# Patient Record
Sex: Male | Born: 1950
Health system: Southern US, Community
[De-identification: ages and names within clinical notes are randomized; demographics above are authoritative.]

## PROBLEM LIST (undated history)

## (undated) DIAGNOSIS — E785 Hyperlipidemia, unspecified: Secondary | ICD-10-CM

## (undated) DIAGNOSIS — I10 Essential (primary) hypertension: Secondary | ICD-10-CM

## (undated) DIAGNOSIS — M1990 Unspecified osteoarthritis, unspecified site: Secondary | ICD-10-CM

## (undated) DIAGNOSIS — K429 Umbilical hernia without obstruction or gangrene: Secondary | ICD-10-CM

## (undated) HISTORY — DX: Essential (primary) hypertension: I10

## (undated) HISTORY — PX: SHOULDER SURGERY: SHX246

## (undated) HISTORY — DX: Unspecified osteoarthritis, unspecified site: M19.90

## (undated) HISTORY — DX: Hyperlipidemia, unspecified: E78.5

---

## 2006-10-20 ENCOUNTER — Ambulatory Visit: Payer: Self-pay | Admitting: Internal Medicine

## 2006-10-21 ENCOUNTER — Ambulatory Visit (HOSPITAL_COMMUNITY): Admission: RE | Admit: 2006-10-21 | Discharge: 2006-10-21 | Payer: Self-pay | Admitting: Family Medicine

## 2006-10-21 ENCOUNTER — Ambulatory Visit: Payer: Self-pay | Admitting: *Deleted

## 2006-10-25 ENCOUNTER — Ambulatory Visit: Payer: Self-pay | Admitting: Internal Medicine

## 2006-11-01 ENCOUNTER — Encounter (INDEPENDENT_AMBULATORY_CARE_PROVIDER_SITE_OTHER): Payer: Self-pay | Admitting: Cardiology

## 2006-11-01 ENCOUNTER — Ambulatory Visit (HOSPITAL_COMMUNITY): Admission: RE | Admit: 2006-11-01 | Discharge: 2006-11-01 | Payer: Self-pay | Admitting: Internal Medicine

## 2006-11-07 ENCOUNTER — Encounter (INDEPENDENT_AMBULATORY_CARE_PROVIDER_SITE_OTHER): Payer: Self-pay | Admitting: Specialist

## 2006-11-07 ENCOUNTER — Ambulatory Visit (HOSPITAL_COMMUNITY): Admission: RE | Admit: 2006-11-07 | Discharge: 2006-11-07 | Payer: Self-pay | Admitting: Gastroenterology

## 2006-11-08 ENCOUNTER — Ambulatory Visit: Payer: Self-pay | Admitting: Internal Medicine

## 2007-01-24 ENCOUNTER — Ambulatory Visit: Payer: Self-pay | Admitting: Internal Medicine

## 2007-05-24 ENCOUNTER — Ambulatory Visit: Payer: Self-pay | Admitting: Internal Medicine

## 2007-06-01 ENCOUNTER — Ambulatory Visit (HOSPITAL_COMMUNITY): Admission: RE | Admit: 2007-06-01 | Discharge: 2007-06-01 | Payer: Self-pay | Admitting: Family Medicine

## 2007-07-17 ENCOUNTER — Ambulatory Visit: Payer: Self-pay | Admitting: Internal Medicine

## 2007-08-03 ENCOUNTER — Ambulatory Visit: Payer: Self-pay | Admitting: Internal Medicine

## 2007-09-06 ENCOUNTER — Encounter (INDEPENDENT_AMBULATORY_CARE_PROVIDER_SITE_OTHER): Payer: Self-pay | Admitting: *Deleted

## 2008-02-14 ENCOUNTER — Ambulatory Visit: Payer: Self-pay | Admitting: Internal Medicine

## 2008-02-14 LAB — CONVERTED CEMR LAB
ALT: 26 units/L (ref 0–53)
AST: 31 units/L (ref 0–37)
Albumin: 3.8 g/dL (ref 3.5–5.2)
Alkaline Phosphatase: 104 units/L (ref 39–117)
BUN: 16 mg/dL (ref 6–23)
HDL: 35 mg/dL — ABNORMAL LOW (ref >39)
LDL Cholesterol: 58 mg/dL (ref 0–99)
Microalb, Ur: 0.65 mg/dL (ref 0.00–1.89)
Potassium: 4 meq/L (ref 3.5–5.3)
Sodium: 135 meq/L (ref 135–145)

## 2008-10-09 ENCOUNTER — Ambulatory Visit: Payer: Self-pay | Admitting: Internal Medicine

## 2009-03-26 ENCOUNTER — Ambulatory Visit: Payer: Self-pay | Admitting: Internal Medicine

## 2009-03-26 LAB — CONVERTED CEMR LAB
ALT: 15 units/L (ref 0–53)
AST: 20 units/L (ref 0–37)
Calcium: 9.2 mg/dL (ref 8.4–10.5)
Chloride: 108 meq/L (ref 96–112)
Creatinine, Ser: 1.08 mg/dL (ref 0.40–1.50)
Microalb, Ur: 0.65 mg/dL (ref 0.00–1.89)
Sodium: 142 meq/L (ref 135–145)
Total Bilirubin: 1 mg/dL (ref 0.3–1.2)
Total CHOL/HDL Ratio: 3.3
VLDL: 47 mg/dL — ABNORMAL HIGH (ref 0–40)

## 2009-03-27 ENCOUNTER — Ambulatory Visit: Payer: Self-pay | Admitting: Internal Medicine

## 2009-07-17 ENCOUNTER — Ambulatory Visit: Payer: Self-pay | Admitting: Internal Medicine

## 2009-07-30 ENCOUNTER — Encounter (INDEPENDENT_AMBULATORY_CARE_PROVIDER_SITE_OTHER): Payer: Self-pay | Admitting: Internal Medicine

## 2009-07-30 ENCOUNTER — Ambulatory Visit: Payer: Self-pay | Admitting: Family Medicine

## 2009-07-30 LAB — CONVERTED CEMR LAB
Basophils Absolute: 0 10*3/uL (ref 0.0–0.1)
Lymphocytes Relative: 27 % (ref 12–46)
Neutro Abs: 2.9 10*3/uL (ref 1.7–7.7)
Platelets: 97 10*3/uL — ABNORMAL LOW (ref 150–400)
RDW: 16.4 % — ABNORMAL HIGH (ref 11.5–15.5)

## 2009-08-11 ENCOUNTER — Ambulatory Visit: Payer: Self-pay | Admitting: Hematology & Oncology

## 2009-08-13 ENCOUNTER — Ambulatory Visit (HOSPITAL_COMMUNITY): Admission: RE | Admit: 2009-08-13 | Discharge: 2009-08-13 | Payer: Self-pay | Admitting: Internal Medicine

## 2009-09-03 LAB — CBC WITH DIFFERENTIAL (CANCER CENTER ONLY)
BASO#: 0 10*3/uL (ref 0.0–0.2)
Eosinophils Absolute: 0.2 10*3/uL (ref 0.0–0.5)
HGB: 12.7 g/dL — ABNORMAL LOW (ref 13.0–17.1)
MCH: 30.6 pg (ref 28.0–33.4)
MCV: 89 fL (ref 82–98)
MONO#: 0.4 10*3/uL (ref 0.1–0.9)
MONO%: 9.3 % (ref 0.0–13.0)
NEUT#: 2.3 10*3/uL (ref 1.5–6.5)
RBC: 4.14 10*6/uL — ABNORMAL LOW (ref 4.20–5.70)
WBC: 4.2 10*3/uL (ref 4.0–10.0)

## 2009-09-03 LAB — TECHNOLOGIST REVIEW CHCC SATELLITE

## 2009-09-03 LAB — CHCC SATELLITE - SMEAR

## 2009-09-05 LAB — VITAMIN B12: Vitamin B-12: 268 pg/mL (ref 211–911)

## 2009-09-05 LAB — PROTEIN ELECTROPHORESIS, SERUM
Albumin ELP: 60.7 % (ref 55.8–66.1)
Total Protein, Serum Electrophoresis: 6.7 g/dL (ref 6.0–8.3)

## 2009-09-15 ENCOUNTER — Ambulatory Visit (HOSPITAL_COMMUNITY): Admission: RE | Admit: 2009-09-15 | Discharge: 2009-09-15 | Payer: Self-pay | Admitting: Hematology & Oncology

## 2009-09-15 ENCOUNTER — Encounter (INDEPENDENT_AMBULATORY_CARE_PROVIDER_SITE_OTHER): Payer: Self-pay | Admitting: Interventional Radiology

## 2009-09-17 ENCOUNTER — Ambulatory Visit: Payer: Self-pay | Admitting: Internal Medicine

## 2009-09-30 ENCOUNTER — Ambulatory Visit: Payer: Self-pay | Admitting: Hematology & Oncology

## 2009-10-01 LAB — CBC WITH DIFFERENTIAL (CANCER CENTER ONLY)
BASO#: 0 10*3/uL (ref 0.0–0.2)
Eosinophils Absolute: 0.2 10*3/uL (ref 0.0–0.5)
HGB: 14 g/dL (ref 13.0–17.1)
LYMPH#: 1.7 10*3/uL (ref 0.9–3.3)
NEUT#: 3 10*3/uL (ref 1.5–6.5)
Platelets: 110 10*3/uL — ABNORMAL LOW (ref 145–400)
RBC: 4.58 10*6/uL (ref 4.20–5.70)
WBC: 5.4 10*3/uL (ref 4.0–10.0)

## 2009-10-02 ENCOUNTER — Ambulatory Visit: Payer: Self-pay | Admitting: Internal Medicine

## 2010-01-02 ENCOUNTER — Ambulatory Visit: Payer: Self-pay | Admitting: Hematology & Oncology

## 2010-08-21 ENCOUNTER — Ambulatory Visit: Payer: Self-pay | Admitting: Internal Medicine

## 2010-12-07 ENCOUNTER — Encounter (INDEPENDENT_AMBULATORY_CARE_PROVIDER_SITE_OTHER): Payer: Self-pay | Admitting: Family Medicine

## 2010-12-07 LAB — CONVERTED CEMR LAB: Microalb, Ur: 7.62 mg/dL — ABNORMAL HIGH (ref 0.00–1.89)

## 2011-01-05 ENCOUNTER — Encounter (INDEPENDENT_AMBULATORY_CARE_PROVIDER_SITE_OTHER): Payer: Self-pay | Admitting: Family Medicine

## 2011-01-05 LAB — CONVERTED CEMR LAB
ALT: 34 units/L (ref 0–53)
Alkaline Phosphatase: 64 units/L (ref 39–117)
Glucose, Bld: 138 mg/dL — ABNORMAL HIGH (ref 70–99)
LDL Cholesterol: 40 mg/dL (ref 0–99)
Sodium: 141 meq/L (ref 135–145)
Total Bilirubin: 1 mg/dL (ref 0.3–1.2)
Total Protein: 7.6 g/dL (ref 6.0–8.3)
Triglycerides: 232 mg/dL — ABNORMAL HIGH (ref <150)
VLDL: 46 mg/dL — ABNORMAL HIGH (ref 0–40)

## 2011-03-26 LAB — DIFFERENTIAL
Basophils Absolute: 0 10*3/uL (ref 0.0–0.1)
Basophils Relative: 1 % (ref 0–1)
Eosinophils Absolute: 0.1 10*3/uL (ref 0.0–0.7)
Lymphocytes Relative: 24 % (ref 12–46)
Neutrophils Relative %: 58 % (ref 43–77)

## 2011-03-26 LAB — CBC
MCHC: 35 g/dL (ref 30.0–36.0)
RBC: 4.16 MIL/uL — ABNORMAL LOW (ref 4.22–5.81)

## 2011-03-26 LAB — PROTIME-INR: Prothrombin Time: 16.8 seconds — ABNORMAL HIGH (ref 11.6–15.2)

## 2011-03-26 LAB — GLUCOSE, CAPILLARY: Glucose-Capillary: 109 mg/dL — ABNORMAL HIGH (ref 70–99)

## 2011-05-07 NOTE — Op Note (Signed)
[name], [name] NO.:  [id number]   MEDICAL RECORD NO.:  [id number]          PATIENT TYPE:  AMB   LOCATION:  ENDO                         FACILITY:  [facility]   PHYSICIAN:  Graylin Shiver, M.D.   DATE OF BIRTH:  06/24/1951   DATE OF PROCEDURE:  11/07/2006  DATE OF DISCHARGE:                                 OPERATIVE REPORT   Esophagogastroduodenoscopy with biopsy.   INDICATION:  Anemia.   Informed consent was obtained after explanation of the risks of bleeding,  infection and perforation.   PREMEDICATION:  Fentanyl 50 mcg IV, Versed 4 mg IV.   PROCEDURE:  With the patient in the left lateral decubitus position, the  Olympus gastroscope was inserted into the oropharynx and passed into the  esophagus.  It was advanced down the esophagus then into the stomach and  into the duodenum.  The second portion and bulb of the duodenum looked  normal.  The stomach showed some mild erythema.  It was biopsied from the  antral area.  No ulcers, erosions or tumors were seen.  The body of the  stomach looked normal.  The fundus and cardia looked normal.  The esophagus  looked normal.  He tolerated the procedure well without complications.   IMPRESSION:  Mild erythema in the distal stomach, otherwise normal EGD,  biopsy was obtained from the distal stomach.   I see nothing on this exam to explain anemia.           ______________________________  Graylin Shiver, M.D.     SFG/MEDQ  D:  11/07/2006  T:  11/07/2006  Job:  [id number]   cc:   Dineen Kid. Reche Dixon, M.D.

## 2011-11-19 ENCOUNTER — Encounter: Payer: Self-pay | Admitting: Cardiology

## 2011-12-10 ENCOUNTER — Ambulatory Visit: Payer: Self-pay | Admitting: Cardiology

## 2012-01-18 ENCOUNTER — Encounter: Payer: Self-pay | Admitting: Cardiology

## 2012-01-18 ENCOUNTER — Ambulatory Visit (INDEPENDENT_AMBULATORY_CARE_PROVIDER_SITE_OTHER): Payer: Self-pay | Admitting: Cardiology

## 2012-01-18 VITALS — BP 141/85 | HR 102 | Resp 16 | Ht 66.0 in | Wt 183.0 lb

## 2012-01-18 DIAGNOSIS — R0989 Other specified symptoms and signs involving the circulatory and respiratory systems: Secondary | ICD-10-CM

## 2012-01-18 DIAGNOSIS — R9431 Abnormal electrocardiogram [ECG] [EKG]: Secondary | ICD-10-CM | POA: Insufficient documentation

## 2012-01-18 DIAGNOSIS — Z9189 Other specified personal risk factors, not elsewhere classified: Secondary | ICD-10-CM | POA: Insufficient documentation

## 2012-01-18 DIAGNOSIS — R0600 Dyspnea, unspecified: Secondary | ICD-10-CM

## 2012-01-18 DIAGNOSIS — F17200 Nicotine dependence, unspecified, uncomplicated: Secondary | ICD-10-CM

## 2012-01-18 DIAGNOSIS — R0609 Other forms of dyspnea: Secondary | ICD-10-CM

## 2012-01-18 NOTE — Assessment & Plan Note (Signed)
He uses some form of [place] smokeless tobacco and I suggested that he stop all tobacco products.

## 2012-01-18 NOTE — Assessment & Plan Note (Signed)
This will be evaluated in the context of treating the above.

## 2012-01-18 NOTE — Assessment & Plan Note (Signed)
The patient certainly has significant risk factors for coronary disease. He did have some mild aortic stiffness on an echo in 2007 I was able to find. I don't hear a murmur. However, I'm going to start with an echocardiogram. He also needs stress testing to further evaluate the etiology of this dyspnea. He needs primary risk reduction.

## 2012-01-18 NOTE — Patient Instructions (Signed)
Your physician has requested that you have an echocardiogram. Echocardiography is a painless test that uses sound waves to create images of your heart. It provides your doctor with information about the size and shape of your heart and how well your heart's chambers and valves are working. This procedure takes approximately one hour. There are no restrictions for this procedure.  Your physician has requested that you have a lexiscan myoview. For further information please visit [email or web address] Please follow instruction sheet, as given.  Continue all medications as listed  Follow up with Dr Seward Grater after testing

## 2012-01-18 NOTE — Progress Notes (Signed)
HPI The patient presents as a new patient. He has an abnormal EKG and dyspnea. The predominant issue is that he has multiple cardiovascular risk factors. He reports dyspnea for 4-5 months. This happens with activity such as walking an incline or upstairs 8 or 9 stairs at a time. He doesn't describe resting shortness of breath, PND or orthopnea. He does not describe chest pressure, neck or arm discomfort. He does have a fullness when he is short of breath. He thinks his symptoms have been relatively unchanged over 4-5 months. He's not describing any palpitations, presyncope or syncope. He does have some chronic mild ankle edema. It somewhat difficult obtaining his history as he speaks broken [place] and Hindi.  Not on File  Current Outpatient Prescriptions  Medication Sig Dispense Refill  . aspirin 81 MG tablet Take 81 mg by mouth daily.      Marland Kitchen glipiZIDE (GLUCOTROL) 5 MG tablet Take 5 mg by mouth 2 (two) times daily before a meal.      . ibuprofen (ADVIL,MOTRIN) 200 MG tablet Take 200 mg by mouth every 6 (six) hours as needed.      Marland Kitchen losartan (COZAAR) 25 MG tablet Take 25 mg by mouth daily.      . metFORMIN (GLUCOPHAGE) 1000 MG tablet Take 1,000 mg by mouth 2 (two) times daily with a meal.      . metolazone (ZAROXOLYN) 5 MG tablet Take 5 mg by mouth daily.      . pravastatin (PRAVACHOL) 10 MG tablet Take 10 mg by mouth daily.        Past Medical History  Diagnosis Date  . Arthritis   . Hyperlipidemia   . Diabetes mellitus   . Hypertension     Past Surgical History  Procedure Date  . Shoulder surgery     Right    Family History  Problem Relation Age of Onset  . Hypertension Mother 49  . Coronary artery disease Father 65    History   Social History  . Marital Status: Married    Spouse Name: N/A    Number of Children: 2  . Years of Education: N/A   Occupational History  . Works for [employer]    Social History Main Topics  . Smoking status: Never Smoker   .  Smokeless tobacco: Current User    Types: Chew  . Alcohol Use: Not on file  . Drug Use: Not on file  . Sexually Active: Not on file   Other Topics Concern  . Not on file   Social History Narrative  . No narrative on file    ROS:  As stated in the HPI and negative for all other systems.  PHYSICAL EXAM BP 141/85  Pulse 102  Resp 16  Ht 5\' 6"  (1.676 m)  Wt 83.008 kg (183 lb)  BMI 29.54 kg/m2 GENERAL:  Well appearing HEENT:  Pupils equal round and reactive, fundi not visualized, oral mucosa unremarkable NECK:  No jugular venous distention, waveform within normal limits, carotid upstroke brisk and symmetric, no bruits, no thyromegaly LYMPHATICS:  No cervical, inguinal adenopathy LUNGS:  Clear to auscultation bilaterally BACK:  No CVA tenderness CHEST:  Unremarkable HEART:  PMI not displaced or sustained,S1 and S2 within normal limits, no S3, no S4, no clicks, no rubs, no murmurs ABD:  Flat, positive bowel sounds normal in frequency in pitch, no bruits, no rebound, no guarding, no midline pulsatile mass, no hepatomegaly, no splenomegaly EXT:  2 plus pulses  throughout, no edema, no cyanosis no clubbing SKIN:  No rashes no nodules NEURO:  Cranial nerves II through XII grossly intact, motor grossly intact throughout [facility]:  Cognitively intact, oriented to person place and time  EKG:  11/19/11 sinus rhythm, rate 80, left axis deviation, QTC slightly prolonged, no acute ST-T wave changes appear  ASSESSMENT AND PLAN

## 2012-01-18 NOTE — Assessment & Plan Note (Signed)
I will check a lipid profile when he returns.  Given the diabetes I will have a low threshold for starting a statin.

## 2012-02-02 ENCOUNTER — Ambulatory Visit (INDEPENDENT_AMBULATORY_CARE_PROVIDER_SITE_OTHER): Payer: Self-pay | Admitting: *Deleted

## 2012-02-02 ENCOUNTER — Other Ambulatory Visit: Payer: Self-pay

## 2012-02-02 ENCOUNTER — Ambulatory Visit (HOSPITAL_COMMUNITY): Payer: Self-pay | Attending: Cardiovascular Disease

## 2012-02-02 ENCOUNTER — Ambulatory Visit (HOSPITAL_COMMUNITY): Payer: Self-pay | Attending: Cardiovascular Disease | Admitting: Radiology

## 2012-02-02 VITALS — BP 119/74 | Ht 66.0 in | Wt 170.0 lb

## 2012-02-02 DIAGNOSIS — R0600 Dyspnea, unspecified: Secondary | ICD-10-CM

## 2012-02-02 DIAGNOSIS — R0609 Other forms of dyspnea: Secondary | ICD-10-CM | POA: Insufficient documentation

## 2012-02-02 DIAGNOSIS — R0989 Other specified symptoms and signs involving the circulatory and respiratory systems: Secondary | ICD-10-CM | POA: Insufficient documentation

## 2012-02-02 DIAGNOSIS — R0602 Shortness of breath: Secondary | ICD-10-CM | POA: Insufficient documentation

## 2012-02-02 DIAGNOSIS — I10 Essential (primary) hypertension: Secondary | ICD-10-CM | POA: Insufficient documentation

## 2012-02-02 DIAGNOSIS — Z9189 Other specified personal risk factors, not elsewhere classified: Secondary | ICD-10-CM

## 2012-02-02 DIAGNOSIS — E785 Hyperlipidemia, unspecified: Secondary | ICD-10-CM | POA: Insufficient documentation

## 2012-02-02 DIAGNOSIS — R9431 Abnormal electrocardiogram [ECG] [EKG]: Secondary | ICD-10-CM | POA: Insufficient documentation

## 2012-02-02 DIAGNOSIS — E119 Type 2 diabetes mellitus without complications: Secondary | ICD-10-CM | POA: Insufficient documentation

## 2012-02-02 DIAGNOSIS — Z8249 Family history of ischemic heart disease and other diseases of the circulatory system: Secondary | ICD-10-CM | POA: Insufficient documentation

## 2012-02-02 DIAGNOSIS — R000 Tachycardia, unspecified: Secondary | ICD-10-CM | POA: Insufficient documentation

## 2012-02-02 LAB — LIPID PANEL: Total CHOL/HDL Ratio: 5

## 2012-02-02 MED ORDER — TECHNETIUM TC 99M TETROFOSMIN IV KIT
33.0000 | PACK | Freq: Once | INTRAVENOUS | Status: AC | PRN
  Administered 2012-02-02: 33 via INTRAVENOUS

## 2012-02-02 MED ORDER — TECHNETIUM TC 99M TETROFOSMIN IV KIT
10.8000 | PACK | Freq: Once | INTRAVENOUS | Status: AC | PRN
  Administered 2012-02-02: 11 via INTRAVENOUS

## 2012-02-02 NOTE — Progress Notes (Signed)
[facility] SITE 3 NUCLEAR MED [address] [place] [address] [phone]  Cardiology Nuclear Med Study  Mark Chase is a 61 y.o. male [id number] 1951-08-12   Nuclear Med Background Indication for Stress Test:  Evaluation for Ischemia and Abnormal EKG History: '07 Echo:EF=60-70% Cardiac Risk Factors: Family History - CAD, Hypertension, Lipids and NIDDM  Symptoms:  DOE/SOB and Rapid HR    Nuclear Pre-Procedure Caffeine/Decaff Intake:  None NPO After: 7:00pm   Lungs:  Clear.  98% on RA. IV 0.9% NS with Angio Cath:  20g  IV Site: R Hand  IV Started by:  Bonnita Levan, RN  Chest Size (in):  44 Cup Size: n/a  Height: 5\' 6"  (1.676 m)  Weight:  170 lb (77.111 kg)  BMI:  Body mass index is 27.44 kg/(m^2). Tech Comments:  BS 132 this AM    Nuclear Med Study 1 or 2 day study: 1 day  Stress Test Type:  Stress  Reading MD: Charlton Haws, MD  Order Authorizing Provider:  Rollene Rotunda, MD  Resting Radionuclide: Technetium 48m Tetrofosmin  Resting Radionuclide Dose: 10.8 mCi   Stress Radionuclide:  Technetium 100m Tetrofosmin  Stress Radionuclide Dose: 33.0 mCi           Stress Protocol Rest HR: 71 Stress HR: 139  Rest BP: Sitting 119/74  Standing 134/83 Stress BP: 173/100*  Exercise Time (min): 6:46 METS: 7.0   Predicted Max HR: 160 bpm % Max HR: 86.88 bpm Rate Pressure Product: 27253   Dose of Adenosine (mg):  n/a Dose of Lexiscan: n/a mg  Dose of Atropine (mg): n/a Dose of Dobutamine: n/a mcg/kg/min (at max HR)  Stress Test Technologist: Smiley Houseman, CMA-N  Nuclear Technologist:  Doyne Keel, CNMT     Rest Procedure:  Myocardial perfusion imaging was performed at rest 45 minutes following the intravenous administration of Technetium 76m Tetrofosmin.  Rest ECG: Nonspecific T-wave changes.  Stress Procedure:  The patient exercised for 6:46 on the treadmill utilizing the Bruce protocol.  The patient stopped due to dyspnea with O2 SAT of 93% at peak  exercise and denied any chest pain.  There were no diagnostic ST-T wave changes.  He did have a mild hypertensive response to exercise, 173/100,  Technetium 65m Tetrofosmin was injected at peak exercise and myocardial perfusion imaging was performed after a brief delay.  Stress ECG: Baseline ECG abnormal with inferolateral T wave changes  QPS Raw Data Images:  Normal; no motion artifact; normal heart/lung ratio. Stress Images:  Normal homogeneous uptake in all areas of the myocardium. Rest Images:  Normal homogeneous uptake in all areas of the myocardium. Subtraction (SDS):  Normal Transient Ischemic Dilatation (Normal <1.22):  0.99 Lung/Heart Ratio (Normal <0.45):  0.28  Quantitative Gated Spect Images QGS EDV:  56 ml QGS ESV:  13 ml QGS cine images:  NL LV Function; NL Wall Motion QGS EF: 76%  Impression Exercise Capacity:  Fair exercise capacity. BP Response:  Normal blood pressure response. Clinical Symptoms:  There is dyspnea. ECG Impression:  Baseline ECG abnormal Comparison with Prior Nuclear Study: No previous nuclear study performed  Overall Impression:  Normal stress nuclear study.      Charlton Haws

## 2012-02-04 ENCOUNTER — Telehealth: Payer: Self-pay | Admitting: Cardiology

## 2012-02-04 NOTE — Telephone Encounter (Signed)
New Problem:     Patient's daughter called in wanting to know what the results were of her father's most recent blood work and cardiac testing. Please call back.

## 2012-02-04 NOTE — Telephone Encounter (Signed)
Reviewed all results with pt's daughter who states understanding

## 2012-10-05 ENCOUNTER — Other Ambulatory Visit: Payer: Self-pay

## 2012-10-05 ENCOUNTER — Telehealth: Payer: Self-pay | Admitting: Hematology & Oncology

## 2012-10-05 NOTE — Telephone Encounter (Signed)
Received call from daughter that pt was being referred back to [facility]. Per Tiffany in medical records has not received anything on this pt. I called Dr. Benetta Spar Rankin's office and Marylene Land is going to send me what they have.

## 2012-10-05 NOTE — Telephone Encounter (Signed)
Pt aware of 10-25-12 appointment. I called right back after talking to daughter and got voice mail. I left a message asking them to please call if we need to provide an interpreter.

## 2012-10-25 ENCOUNTER — Other Ambulatory Visit (HOSPITAL_BASED_OUTPATIENT_CLINIC_OR_DEPARTMENT_OTHER): Payer: Medicaid Other | Admitting: Lab

## 2012-10-25 ENCOUNTER — Ambulatory Visit (HOSPITAL_BASED_OUTPATIENT_CLINIC_OR_DEPARTMENT_OTHER): Payer: Medicaid Other | Admitting: Hematology & Oncology

## 2012-10-25 ENCOUNTER — Ambulatory Visit: Payer: Medicaid Other

## 2012-10-25 VITALS — BP 140/61 | HR 89 | Temp 97.9°F | Resp 20 | Ht 66.0 in | Wt 172.0 lb

## 2012-10-25 DIAGNOSIS — D696 Thrombocytopenia, unspecified: Secondary | ICD-10-CM

## 2012-10-25 DIAGNOSIS — E119 Type 2 diabetes mellitus without complications: Secondary | ICD-10-CM

## 2012-10-25 DIAGNOSIS — I10 Essential (primary) hypertension: Secondary | ICD-10-CM

## 2012-10-25 LAB — CBC WITH DIFFERENTIAL (CANCER CENTER ONLY)
BASO%: 0.1 % (ref 0.0–2.0)
EOS%: 0.9 % (ref 0.0–7.0)
HCT: 40.3 % (ref 38.7–49.9)
LYMPH%: 21.8 % (ref 14.0–48.0)
MCH: 30.2 pg (ref 28.0–33.4)
MCHC: 36.2 g/dL — ABNORMAL HIGH (ref 32.0–35.9)
MCV: 83 fL (ref 82–98)
NEUT%: 64.8 % (ref 40.0–80.0)
RDW: 14.7 % (ref 11.1–15.7)

## 2012-10-25 NOTE — Progress Notes (Signed)
This office note has been dictated.

## 2012-10-26 NOTE — Progress Notes (Signed)
CC:   Mark Chase, M.D.  DIAGNOSIS:  Mild thrombocytopenia.  HISTORY OF PRESENT ILLNESS:  Mark Chase is a 61 year old [place] gentleman.  He has been in the [place] for, I think, 7 years or so.  He has had problems with diabetes and hypertension and hyperlipidemia.  He is on several medications.  He is followed by Dr. Barbaraann Barthel.  Dr. Barbaraann Barthel had noted that he had some thrombocytopenia.  Mark Chase was seen 3 years ago for thrombocytopenia.  He actually had a bone marrow test done.  The bone marrow test basically was negative for any marrow pathology.  He had abundant megakaryocytes.  Cytogenetics were negative.  There was no evidence of an infiltrative of disease.  He has been doing okay.  No bleeding or bruising.  He has not had any weight loss or weight gain.  He is working without any problems.  Dr. Barbaraann Barthel felt that a hematologic evaluation was indicated and kindly referred him to the [facility].  His platelet count back on September 17th was 98,000.  His white cell count was 5.9 and hemoglobin 13.5.  Again, he has had no bleeding or bruising.  He has not noticed any skin rashes.  There has not been any change in his medications that he knows of.  He has not had any surgeries.  He has not had any leg swelling.  He has had no cough or shortness breath.  There has been no foreign travel.  Of note, back in August 2010 his platelet count was 97,000.  PAST MEDICAL HISTORY:  Remarkable for:  1. Non-insulin-dependent diabetes mellitus. 2. Hypertension. 3. Hyperlipidemia.  ALLERGIES:  None.  MEDICATIONS:  Aspirin 81 mg p.o. daily, Glucotrol 5 mg p.o. b.i.d., Cozaar 25 mg p.o. daily, Glucophage 1000 mg p.o. b.i.d., Zaroxolyn 5 mg p.o. daily, Pravachol 10 mg p.o. daily.  SOCIAL HISTORY:  Negative for tobacco use.  There is no alcohol use.  He has no occupational exposures.  FAMILY HISTORY:  Noncontributory.  REVIEW OF SYSTEMS:  As stated in  the history of present illness.  No additional findings are noted on 12-system review.  PHYSICAL EXAMINATION:  General:  This is a well-developed, well- nourished [place] gentleman in no obvious distress.  Vital signs: Temperature of 97.9, pulse 89, respiratory rate 20, blood pressure 140/61.  Weight is 172.  Head and neck:  Normocephalic, atraumatic skull.  There are no ocular or oral lesions.  There are no palpable cervical or supraclavicular lymph nodes.  Lungs:  Clear bilaterally. Cardiac:  Regular rate and rhythm with a normal S1 and S2.  There are no murmurs, rubs, or bruits.  Abdomen:  Soft with good bowel sounds.  There is no palpable abdominal mass.  There is no palpable hepatosplenomegaly. Back:  No tenderness over the spine, ribs, or hips.  Extremities:  No clubbing, cyanosis, or edema.  Neurological:  No focal neurological deficits.  Skin:  No rashes, ecchymoses, or petechiae.  LABORATORY STUDIES:  White cell count is 7, hemoglobin 14.6, hematocrit 40.3, platelet count 125.  Peripheral smear shows a normochromic, normocytic population of red blood cells.  There are no nucleated red blood cells.  There are no teardrop cells.  There are no schistocytes or spherocytes.  I see no rouleaux formation.  White cells appear normal in morphology and maturation.  There are no immature myeloid or lymphoid forms.  I see no hypersegmented polys.  I see no atypical lymphocytes.  Platelets are mildly decreased in  number.  Platelets are well granulated.  He has a few large platelets.  IMPRESSION:  Mark Chase is a nice 61 year old gentleman with mild thrombocytopenia.  He has been dealing with this now for 3 years.  His platelet count actually is better now than it was 3 years ago.  He had a bone marrow biopsy 3 years ago which may be suggestive of chronic immune thrombocytopenia.  I just do not see any indication for therapy at this point in time.  I do not see any indication for another  bone marrow test.  I do not see that we need to put him through a battery of a bunch of tests to rule out any kind of collagen vascular disease or other autoimmune phenomenon.  From my point of view, I really do not think we have to see Mark Chase back in the office.  His CBC can be checked twice a year.  If his platelet count gradually declines, then we can certainly see him back. Otherwise, I would just leave him alone.  I would continue his medications as he is taking.  I would keep him on the aspirin.  He is not at risk for bleeding.  I spent a good hour with Mark Chase.  A translator came with him.  Mark Chase, however, seemed to have a fairly good grasp of what was being told.    ______________________________ Mark Chase, M.D. PRE/MEDQ  D:  10/25/2012  T:  10/26/2012  Job:  [id number]

## 2012-10-27 ENCOUNTER — Other Ambulatory Visit: Payer: Self-pay

## 2013-11-02 ENCOUNTER — Encounter (HOSPITAL_COMMUNITY): Payer: Self-pay | Admitting: Emergency Medicine

## 2013-11-02 ENCOUNTER — Emergency Department (HOSPITAL_COMMUNITY): Payer: Medicaid Other

## 2013-11-02 ENCOUNTER — Emergency Department (HOSPITAL_COMMUNITY)
Admission: EM | Admit: 2013-11-02 | Discharge: 2013-11-02 | Disposition: A | Discharge status: Home / Self Care | Payer: Medicaid Other | Attending: Emergency Medicine | Admitting: Emergency Medicine

## 2013-11-02 DIAGNOSIS — M5030 Other cervical disc degeneration, unspecified cervical region: Secondary | ICD-10-CM | POA: Insufficient documentation

## 2013-11-02 DIAGNOSIS — F10129 Alcohol abuse with intoxication, unspecified: Secondary | ICD-10-CM

## 2013-11-02 DIAGNOSIS — M129 Arthropathy, unspecified: Secondary | ICD-10-CM | POA: Insufficient documentation

## 2013-11-02 DIAGNOSIS — I10 Essential (primary) hypertension: Secondary | ICD-10-CM | POA: Insufficient documentation

## 2013-11-02 DIAGNOSIS — F1010 Alcohol abuse, uncomplicated: Secondary | ICD-10-CM | POA: Insufficient documentation

## 2013-11-02 DIAGNOSIS — E785 Hyperlipidemia, unspecified: Secondary | ICD-10-CM | POA: Insufficient documentation

## 2013-11-02 DIAGNOSIS — Z7982 Long term (current) use of aspirin: Secondary | ICD-10-CM | POA: Insufficient documentation

## 2013-11-02 DIAGNOSIS — E119 Type 2 diabetes mellitus without complications: Secondary | ICD-10-CM | POA: Insufficient documentation

## 2013-11-02 DIAGNOSIS — Z79899 Other long term (current) drug therapy: Secondary | ICD-10-CM | POA: Insufficient documentation

## 2013-11-02 LAB — RAPID URINE DRUG SCREEN, HOSP PERFORMED
Amphetamines: NOT DETECTED
Barbiturates: NOT DETECTED
Benzodiazepines: NOT DETECTED
Cocaine: NOT DETECTED
Opiates: NOT DETECTED
Tetrahydrocannabinol: NOT DETECTED

## 2013-11-02 LAB — CBC
HCT: 42.2 % (ref 39.0–52.0)
Hemoglobin: 15.4 g/dL (ref 13.0–17.0)
MCH: 30.6 pg (ref 26.0–34.0)
MCHC: 36.5 g/dL — ABNORMAL HIGH (ref 30.0–36.0)
MCV: 83.9 fL (ref 78.0–100.0)
Platelets: 115 10*3/uL — ABNORMAL LOW (ref 150–400)
RBC: 5.03 MIL/uL (ref 4.22–5.81)
RDW: 13.9 % (ref 11.5–15.5)
WBC: 5.8 10*3/uL (ref 4.0–10.5)

## 2013-11-02 LAB — BASIC METABOLIC PANEL
BUN: 9 mg/dL (ref 6–23)
CO2: 21 mEq/L (ref 19–32)
Calcium: 9.7 mg/dL (ref 8.4–10.5)
Chloride: 105 mEq/L (ref 96–112)
Creatinine, Ser: 1.05 mg/dL (ref 0.50–1.35)
GFR calc Af Amer: 86 mL/min — ABNORMAL LOW (ref >90)
GFR calc non Af Amer: 74 mL/min — ABNORMAL LOW (ref >90)
Glucose, Bld: 156 mg/dL — ABNORMAL HIGH (ref 70–99)
Potassium: 4 mEq/L (ref 3.5–5.1)
Sodium: 142 mEq/L (ref 135–145)

## 2013-11-02 LAB — ETHANOL: Alcohol, Ethyl (B): 322 mg/dL — ABNORMAL HIGH (ref 0–11)

## 2013-11-02 NOTE — ED Notes (Signed)
136 CBG.

## 2013-11-02 NOTE — ED Notes (Signed)
Bed: WA01 Expected date:  Expected time:  Means of arrival:  Comments: EMS-ETOH, found down

## 2013-11-02 NOTE — ED Notes (Addendum)
Patient states he has pain every day in his right shoulder and back for the past 10 years. States he only had 2 beers to drink today, denies use of any other drugs. Patient denies any medical conditions other than HTN. Patient is alert and oriented x 4, speaks Hindi and limited English, appears slow to respond and uses exaggerated hand clapping at inappropriate times. Denies falling or injuring head.

## 2013-11-02 NOTE — ED Notes (Signed)
Patient states that address is [address], [place].

## 2013-11-02 NOTE — ED Notes (Signed)
Per EMS, found on side of road, [address] and [address]-weaving in and out of traffic on foot, found by [facility]-[facility] 166, BP 140/76

## 2013-11-02 NOTE — ED Provider Notes (Signed)
CSN: [id number]     Arrival date & time 11/02/13  1440 History   First MD Initiated Contact with Patient 11/02/13 1507     Chief Complaint  Patient presents with  . Alcohol Intoxication   (Consider location/radiation/quality/duration/timing/severity/associated sxs/prior Treatment) Patient is a 62 y.o. male presenting with intoxication. The history is provided by the patient. The history is limited by a language barrier. A language interpreter was used.  Alcohol Intoxication Associated symptoms include arthralgias, myalgias and neck pain. Pertinent negatives include no abdominal pain, chest pain, headaches or weakness.   Pt is a 62yo male BIB EMS after being found by [employer] on weaving in and out of traffic on foot at [employer] and [address].  On scene, CBG-166, BP 140/76.  Per pt he reports pain in his shoulders and back for 10 years but no new pain today. Reports drinking 2 beers but denies drug use.  Reports having HTN but no other significant medical problems. Denies hx of DM.  Denies falling or hitting head. Denies head, chest or abdominal pain. Denies SOB, nausea or vomiting.   Pt states he does live with his wife but she cannot come to the ER because she is unable to drive.  Denies any other concerns at this time.  Past Medical History  Diagnosis Date  . Arthritis   . Hyperlipidemia   . Diabetes mellitus   . Hypertension    Past Surgical History  Procedure Laterality Date  . Shoulder surgery      Right   Family History  Problem Relation Age of Onset  . Hypertension Mother 35  . Coronary artery disease Father 19   History  Substance Use Topics  . Smoking status: Never Smoker   . Smokeless tobacco: Current User    Types: Chew  . Alcohol Use: Yes    Review of Systems  Cardiovascular: Negative for chest pain.  Gastrointestinal: Negative for abdominal pain.  Musculoskeletal: Positive for arthralgias, myalgias and neck pain. Negative for neck stiffness.       Bilateral shoulder  pain, but rubbing neck.  Neurological: Negative for weakness, light-headedness and headaches.  All other systems reviewed and are negative.    Allergies  Review of patient's allergies indicates no known allergies.  Home Medications   Current Outpatient Rx  Name  Route  Sig  Dispense  Refill  . aspirin 81 MG tablet   Oral   Take 81 mg by mouth daily.         . fenofibrate (TRICOR) 145 MG tablet   Oral   Take 145 mg by mouth daily.         Marland Kitchen gabapentin (NEURONTIN) 300 MG capsule   Oral   Take 300 mg by mouth 2 (two) times daily.         Marland Kitchen glipiZIDE (GLUCOTROL XL) 5 MG 24 hr tablet   Oral   Take 5 mg by mouth daily with breakfast.         . metFORMIN (GLUCOPHAGE) 1000 MG tablet   Oral   Take 1,000 mg by mouth 2 (two) times daily with a meal.         . pravastatin (PRAVACHOL) 40 MG tablet   Oral   Take 40 mg by mouth daily.          BP 132/72  Pulse 114  Temp(Src) 98 F (36.7 C) (Oral)  Resp 16  SpO2 97% Physical Exam  Nursing note and vitals reviewed. Constitutional: He is oriented to person,  place, and time. He appears well-developed and well-nourished.  Pt lying in exam bed, NAD.  HENT:  Head: Normocephalic and atraumatic.  Eyes: Conjunctivae are normal. No scleral icterus.  Neck: Normal range of motion.  Cardiovascular: Normal rate, regular rhythm and normal heart sounds.   Pulmonary/Chest: Effort normal and breath sounds normal. No respiratory distress. He has no wheezes. He has no rales. He exhibits no tenderness.  Abdominal: Soft. Bowel sounds are normal. He exhibits no distension and no mass. There is no tenderness. There is no rebound and no guarding.  Musculoskeletal: Normal range of motion.  Neurological: He is alert and oriented to person, place, and time.  A&Ox3 however uses exaggerated clapping motion at inappropriate times. Slow to speak.  Skin: Skin is warm and dry.    ED Course  Procedures (including critical care time) Labs  Review Labs Reviewed  GLUCOSE, CAPILLARY - Abnormal; Notable for the following:    Glucose-Capillary 139 (*)    All other components within normal limits  CBC - Abnormal; Notable for the following:    MCHC 36.5 (*)    Platelets 115 (*)    All other components within normal limits  BASIC METABOLIC PANEL - Abnormal; Notable for the following:    Glucose, Bld 156 (*)    GFR calc non Af Amer 74 (*)    GFR calc Af Amer 86 (*)    All other components within normal limits  ETHANOL - Abnormal; Notable for the following:    Alcohol, Ethyl (B) 322 (*)    All other components within normal limits  URINE RAPID DRUG SCREEN (HOSP PERFORMED)   Imaging Review Ct Head Wo Contrast  11/02/2013   CLINICAL DATA:  Found on side of road, evaluate for injury  EXAM: CT HEAD WITHOUT CONTRAST  CT NECK WITHOUT CONTRAST  TECHNIQUE: Contiguous axial images were obtained from the base of the skull through the vertex without contrast. Multidetector CT imaging of the neck was performed using the standard protocol without intravenous contrast.  COMPARISON:  None.  FINDINGS: CT HEAD FINDINGS  Mild atrophy with sulcal prominence. Scattered minimal periventricular hypodensities compatible with microvascular ischemic disease. The gray-white differentiation is otherwise well maintained without CT evidence of acute large territory infarct. No intraparenchymal or extra-axial mass or hemorrhage. Normal size and configuration of the ventricles and basilar cisterns. No midline shift. Regional soft tissues are normal. No displaced calvarial fracture.  CT NECK FINDINGS  C1 to the inferior endplate of T1 is imaged.  There is mild straightening of expected cervical lordosis. No anterolisthesis or retrolisthesis. The bilateral facets are normally aligned. The dens is normally positioned between the lateral masses of C1. Normal atlantodental and atlantoaxial articulations.  No fracture or static subluxation of the cervical spine. Cervical  vertebral body heights are preserved. Prevertebral soft tissues are normal.  There is multilevel moderate to severe cervical spine DDD, likely worse at C5-C6 and C6-C7 with disk space height loss, endplate irregularity and posteriorly directed disk osteophyte complexes at these levels. Incidental note is made of mild expansion of the left-sided (sagittal image 38, series 9) and to a lesser extent, the right-sided, C5-C6 transverse foramina (sagittal image 24).  Calcifications within the bilateral carotid bulbs. Regional soft tissues are otherwise normal. Minimal biapical pleural parenchymal thickening, right greater than left. There is a possible 6 mm nodule within the image right lung apex (image 87, series 5).  IMPRESSION: 1. Mild atrophy and microvascular ischemic disease without acute intracranial process 2. No fracture  or static subluxation of the cervical spine. 3. Moderate to severe multilevel cervical spine DDD, likely worse at C5-C6 and C6-C7. 4. Possible mild expansion of the bilateral C5-C6 transverse foramina, left greater than right, nonspecific though could be seen in the setting of a peripheral nerve sheath tumor. Further evaluation with nonemergent cervical spine MRI may be performed as clinically indicated. 5. Possible 6 mm noncalcified nodule within the image right lung apex. Correlation a prior examinations (if available) is recommended. Otherwise, further evaluation with nonemergent chest CT may be performed as clinically indicated.   Electronically Signed   By: Simonne Come M.D.   On: 11/02/2013 17:05   Ct Cervical Spine Wo Contrast  11/02/2013   CLINICAL DATA:  Found on side of road, evaluate for injury  EXAM: CT HEAD WITHOUT CONTRAST  CT NECK WITHOUT CONTRAST  TECHNIQUE: Contiguous axial images were obtained from the base of the skull through the vertex without contrast. Multidetector CT imaging of the neck was performed using the standard protocol without intravenous contrast.  COMPARISON:   None.  FINDINGS: CT HEAD FINDINGS  Mild atrophy with sulcal prominence. Scattered minimal periventricular hypodensities compatible with microvascular ischemic disease. The gray-white differentiation is otherwise well maintained without CT evidence of acute large territory infarct. No intraparenchymal or extra-axial mass or hemorrhage. Normal size and configuration of the ventricles and basilar cisterns. No midline shift. Regional soft tissues are normal. No displaced calvarial fracture.  CT NECK FINDINGS  C1 to the inferior endplate of T1 is imaged.  There is mild straightening of expected cervical lordosis. No anterolisthesis or retrolisthesis. The bilateral facets are normally aligned. The dens is normally positioned between the lateral masses of C1. Normal atlantodental and atlantoaxial articulations.  No fracture or static subluxation of the cervical spine. Cervical vertebral body heights are preserved. Prevertebral soft tissues are normal.  There is multilevel moderate to severe cervical spine DDD, likely worse at C5-C6 and C6-C7 with disk space height loss, endplate irregularity and posteriorly directed disk osteophyte complexes at these levels. Incidental note is made of mild expansion of the left-sided (sagittal image 38, series 9) and to a lesser extent, the right-sided, C5-C6 transverse foramina (sagittal image 24).  Calcifications within the bilateral carotid bulbs. Regional soft tissues are otherwise normal. Minimal biapical pleural parenchymal thickening, right greater than left. There is a possible 6 mm nodule within the image right lung apex (image 87, series 5).  IMPRESSION: 1. Mild atrophy and microvascular ischemic disease without acute intracranial process 2. No fracture or static subluxation of the cervical spine. 3. Moderate to severe multilevel cervical spine DDD, likely worse at C5-C6 and C6-C7. 4. Possible mild expansion of the bilateral C5-C6 transverse foramina, left greater than right,  nonspecific though could be seen in the setting of a peripheral nerve sheath tumor. Further evaluation with nonemergent cervical spine MRI may be performed as clinically indicated. 5. Possible 6 mm noncalcified nodule within the image right lung apex. Correlation a prior examinations (if available) is recommended. Otherwise, further evaluation with nonemergent chest CT may be performed as clinically indicated.   Electronically Signed   By: Simonne Come M.D.   On: 11/02/2013 17:05    EKG Interpretation   None       MDM   1. Alcohol abuse with intoxication   2. Degenerative disc disease, cervical    Pt with possible Etoh intoxication found by [employer] earlier today weaving in and out of traffic on foot.  Pt reports just 2 beers today  but appears to be slightly altered and keeps making hand clapping gesture at inappropriate times.  Pt does have minor abrasion above left eyebrow.  Will get head and neck CT.  Labs: CBC, BMP, etoh, and urine drug.  CBG: 136  CT head: mild atrophy and microvascular ischemic disease w/o acute intracranial process CT neck: moderate to severe multilevel cervical spina DDD, worse at C5-6, C6-C7.  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 Non-emergent cervical spine MRI recommended.  Discussed findings and recommendations with pt.  While waiting on lab results, pt stated he was ready to go home.  Admits to being paid today at work so he went and bought alcohol to drink.  Pt is unclear on how much he consumed but states he feels better.  Pt is more alert and walking with steady gait.  Once labs are back, pt may be discharged home as he appears clinically sober at this time.   Labs: consistent with pt's report of drinking alcohol as well as findings of clinical intoxication.  Pt is able to ambulate around ER w/o assistance and is anxious to go home.  Pt discharged in stable continue. Return precautions provided. Advised to f/u with his PCP and share CT findings with  him.  Pt info packet on alcohol intoxication provided. Pt verbalized understanding with tx plan.  Discussed pt with attending during ED encounter and agrees with plan.    Junius Finner, PA-C 11/02/13 2017

## 2013-11-03 NOTE — ED Provider Notes (Signed)
Medical screening examination/treatment/procedure(s) were performed by non-physician practitioner and as supervising physician I was immediately available for consultation/collaboration.  EKG Interpretation   None         William Christena Sunderlin, MD 11/03/13 0031 

## 2014-12-25 ENCOUNTER — Ambulatory Visit: Payer: Medicaid Other | Attending: Family Medicine | Admitting: Physical Therapy

## 2014-12-25 DIAGNOSIS — M542 Cervicalgia: Secondary | ICD-10-CM | POA: Diagnosis not present

## 2015-05-19 IMAGING — CT CT HEAD W/O CM
3 series · 16 of 30 positions shown, 18 images · non-contrast
Comparison: None.

CLINICAL DATA: Found on side of road, evaluate for injury

EXAM:
CT HEAD WITHOUT CONTRAST
CT NECK WITHOUT CONTRAST
TECHNIQUE: Contiguous axial images were obtained from the base of the skull
through the vertex without contrast. Multidetector CT imaging of the
neck was performed using the standard protocol without intravenous
contrast.

[Series 2: head w/o · axial · non-contrast · 0.47mm/px · z∈[-665,-560]mm · 5 of 33 slices shown, 7 images]
[im 6/33  brain]
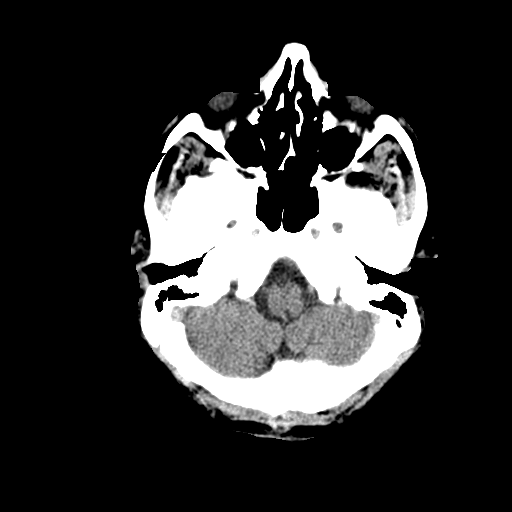
[im 6/33  bone]
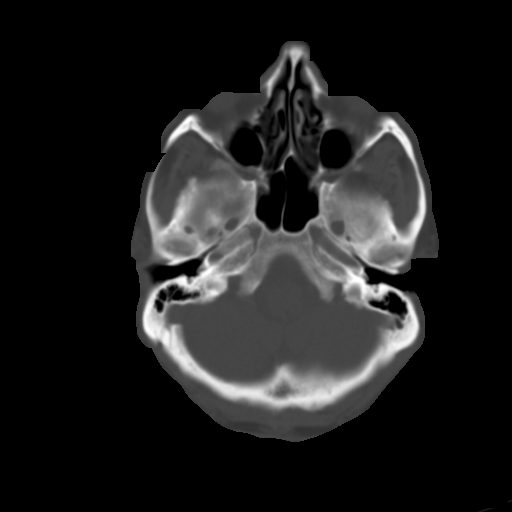
[im 11/33  brain]
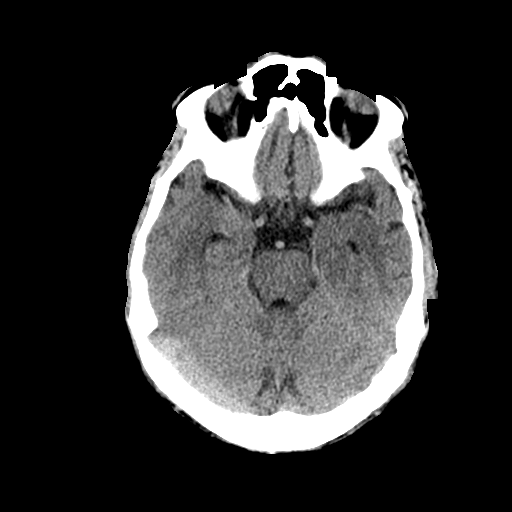
[im 17/33  brain]
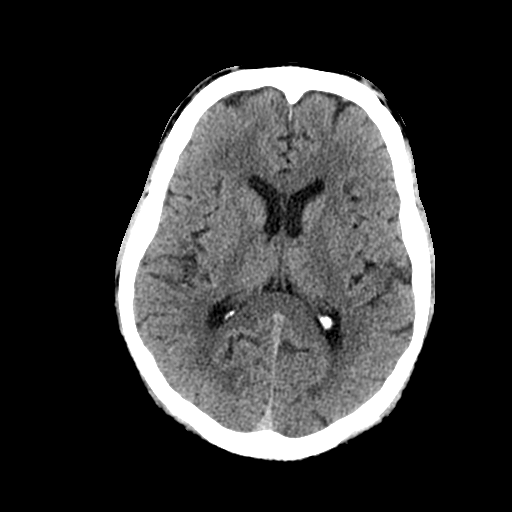
[im 22/33  brain]
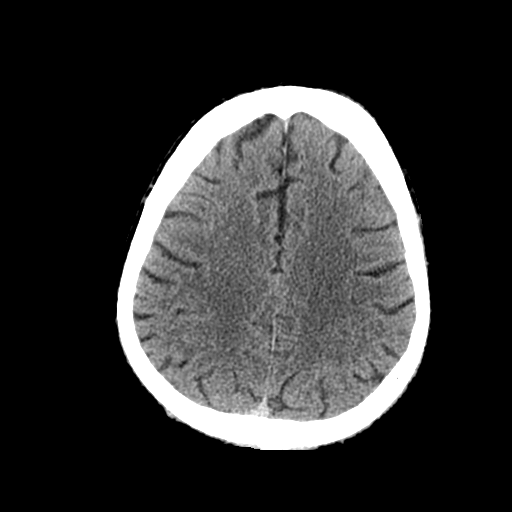
[im 27/33  brain]
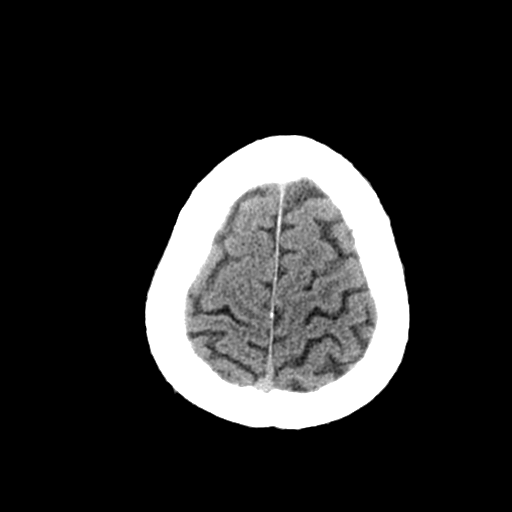
[im 27/33  bone]
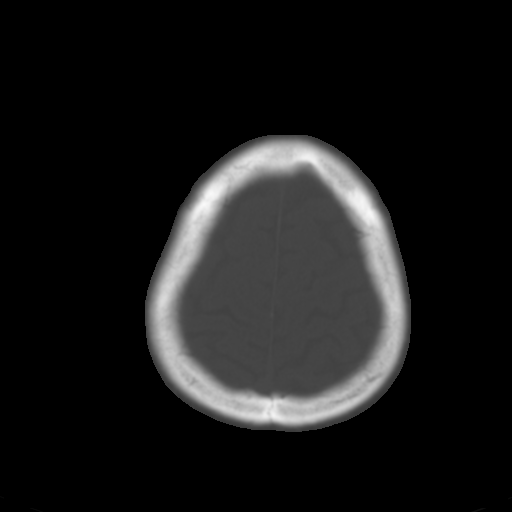

[Series 3: bone windows · axial · 0.47mm/px · z∈[-660,-565]mm · 4 of 33 slices shown]
[im 7/33  bone]
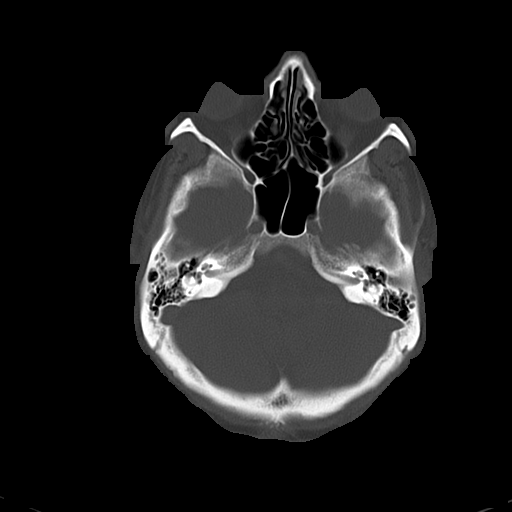
[im 13/33  bone]
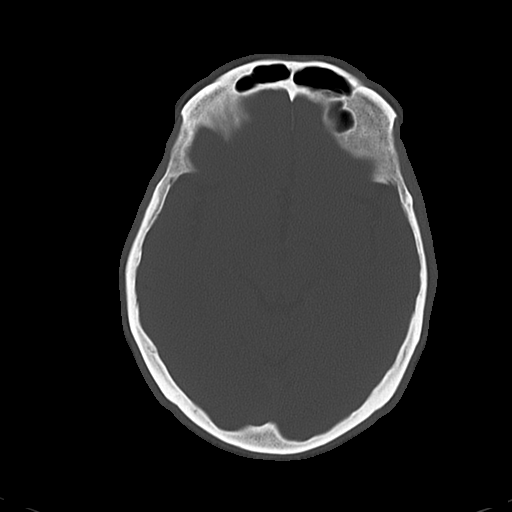
[im 20/33  bone]
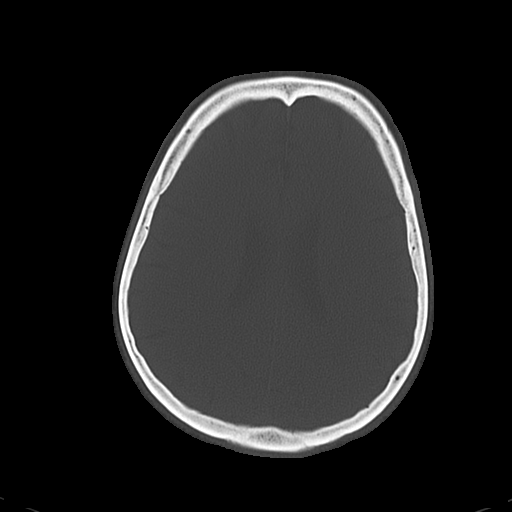
[im 26/33  bone]
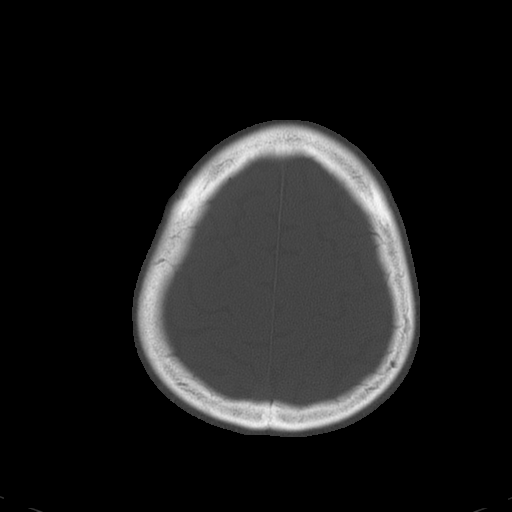

[Series 4: c-spine st · axial · 0.26mm/px · z∈[-826,-698]mm · 7 of 87 slices shown]
[im 6/87  brain]
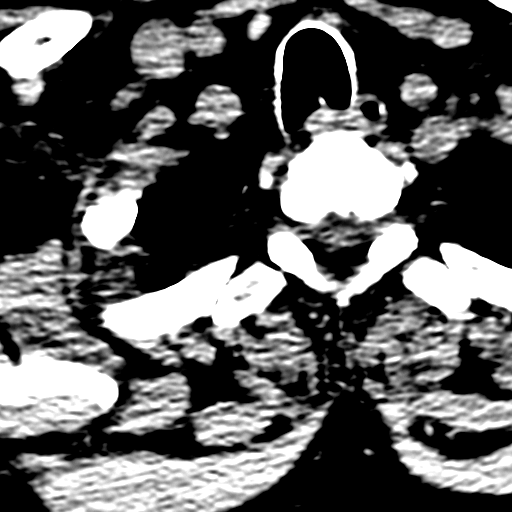
[im 17/87  brain]
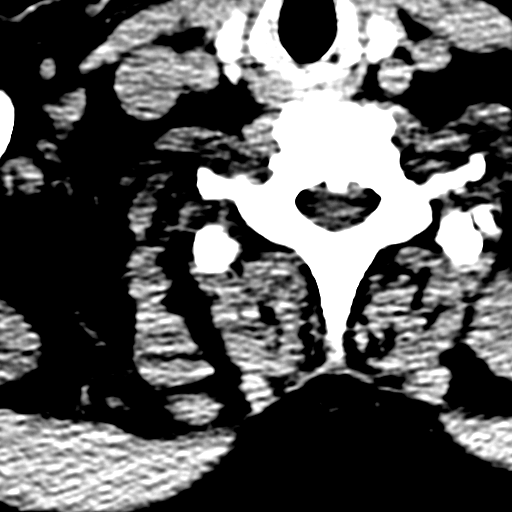
[im 27/87  brain]
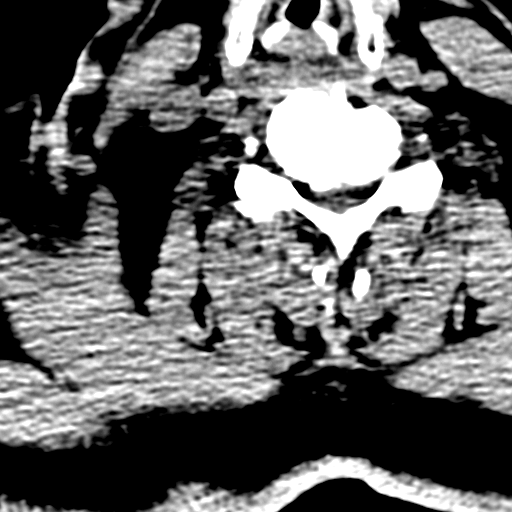
[im 38/87  brain]
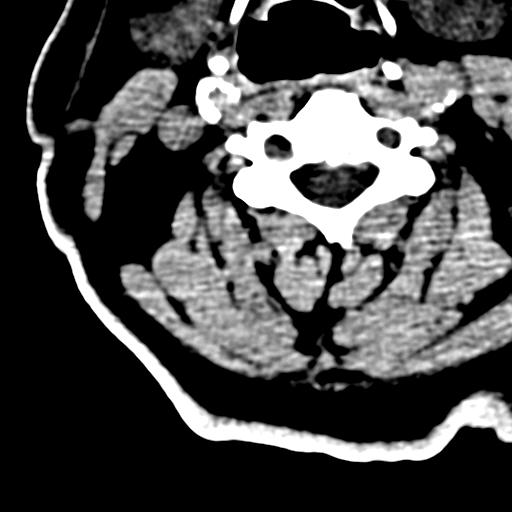
[im 49/87  brain]
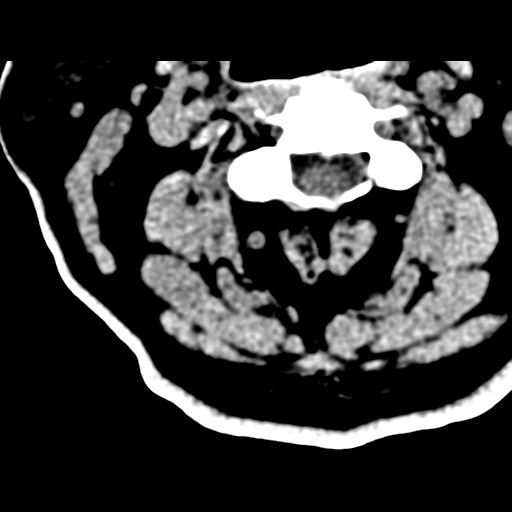
[im 60/87  brain]
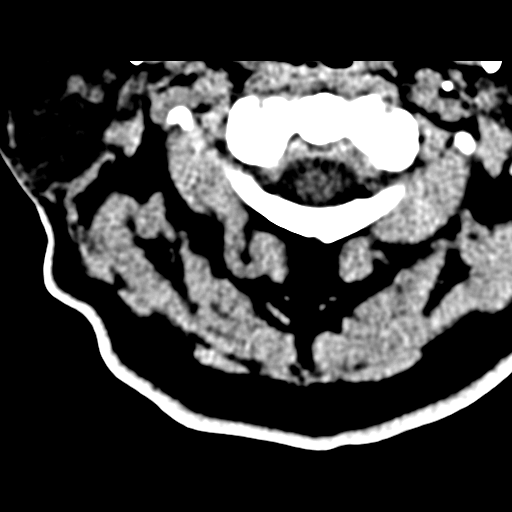
[im 70/87  brain]
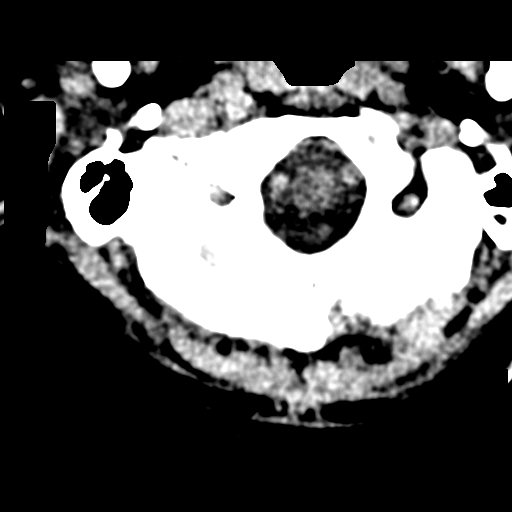

[16 of 30 positions shown; findings below may reference images not displayed]

FINDINGS: CT HEAD FINDINGS

Mild atrophy with sulcal prominence. Scattered minimal
periventricular hypodensities compatible with microvascular ischemic
disease. The gray-white differentiation is otherwise well maintained
without CT evidence of acute large territory infarct. No
intraparenchymal or extra-axial mass or hemorrhage. Normal size and
configuration of the ventricles and basilar cisterns. No midline
shift. Regional soft tissues are normal. No displaced calvarial
fracture.

CT NECK FINDINGS

C1 to the inferior endplate of T1 is imaged.

There is mild straightening of expected cervical lordosis. No
anterolisthesis or retrolisthesis. The bilateral facets are normally
aligned. The dens is normally positioned between the lateral masses
of C1. Normal atlantodental and atlantoaxial articulations.

No fracture or static subluxation of the cervical spine. Cervical
vertebral body heights are preserved. Prevertebral soft tissues are
normal.

There is multilevel moderate to severe cervical spine DDD, likely
worse at C5-C6 and C6-C7 with disk space height loss, endplate
irregularity and posteriorly directed disk osteophyte complexes at
these levels. Incidental note is made of mild expansion of the
left-sided (sagittal image 38, series 9) and to a lesser extent, the
right-sided, C5-C6 transverse foramina (sagittal image 24).

Calcifications within the bilateral carotid bulbs. Regional soft
tissues are otherwise normal. Minimal biapical pleural parenchymal
thickening, right greater than left. There is a possible 6 mm nodule
within the image right lung apex (image 87, series 5).
IMPRESSION: 1. Mild atrophy and microvascular ischemic disease without acute
intracranial process
2. No fracture or static subluxation of the cervical spine.
3. Moderate to severe multilevel cervical spine DDD, likely worse at
C5-C6 and C6-C7.
4. Possible mild expansion of the bilateral C5-C6 transverse
foramina, left greater than right, nonspecific though could be seen
in the setting of a peripheral nerve sheath tumor. Further
evaluation with nonemergent cervical spine MRI may be performed as
clinically indicated.
5. Possible 6 mm noncalcified nodule within the image right lung
apex. Correlation a prior examinations (if available) is
recommended. Otherwise, further evaluation with nonemergent chest CT
may be performed as clinically indicated.

## 2015-07-17 ENCOUNTER — Telehealth: Payer: Self-pay | Admitting: *Deleted

## 2015-07-17 NOTE — Telephone Encounter (Signed)
Requesting surgical clearance:   1. Type of surgery: Hernia Repair (under General anesthesia)  2. Surgeon: Gaynelle Adu  3. Surgical date: Pending  4. Medications that need to be help: Aspirin  5. Fax and/or phone: Deon Pilling, LPN (F) [phone]   How long can patient hold Aspirin?

## 2015-07-18 NOTE — Telephone Encounter (Signed)
Looks like he would need a new patient appt.  Doesn't look like the patient has been seen in 3 years.

## 2015-07-18 NOTE — Telephone Encounter (Signed)
Called pt and stated he will need an appointment, message send to billie for scheduling

## 2015-07-21 ENCOUNTER — Telehealth: Payer: Self-pay | Admitting: Cardiovascular Disease

## 2015-07-21 NOTE — Telephone Encounter (Signed)
Received records from [facility] for appointment on 07/25/15 with Dr Duke Salvia.  Records given to [facility] (medical records) for Dr Leonides Sake schedule on 07/25/15. lp

## 2015-07-25 ENCOUNTER — Ambulatory Visit (INDEPENDENT_AMBULATORY_CARE_PROVIDER_SITE_OTHER): Payer: Medicaid Other | Admitting: Cardiovascular Disease

## 2015-07-25 ENCOUNTER — Encounter: Payer: Self-pay | Admitting: Cardiovascular Disease

## 2015-07-25 VITALS — BP 136/80 | HR 76 | Ht 66.0 in | Wt 170.1 lb

## 2015-07-25 DIAGNOSIS — Z01818 Encounter for other preprocedural examination: Secondary | ICD-10-CM | POA: Diagnosis not present

## 2015-07-25 LAB — BASIC METABOLIC PANEL
BUN: 14 mg/dL (ref 7–25)
CO2: 27 mmol/L (ref 20–31)
CREATININE: 1.14 mg/dL (ref 0.70–1.25)
Calcium: 9.7 mg/dL (ref 8.6–10.3)
Chloride: 100 mmol/L (ref 98–110)
Glucose, Bld: 141 mg/dL — ABNORMAL HIGH (ref 65–99)
Potassium: 5 mmol/L (ref 3.5–5.3)
SODIUM: 137 mmol/L (ref 135–146)

## 2015-07-25 NOTE — Patient Instructions (Signed)
PLEASE HAVE LABS - BMP  LAB  STATION OF YOUR CHOICE.  WILL CALL YOU WITH RESULTS.   Your physician recommends that you schedule a follow-up appointment ON AS NEEDED BASIS.

## 2015-07-25 NOTE — Progress Notes (Signed)
Cardiology Office Note   Date:  07/25/2015   ID:  Mark Chase, DOB 1951/05/04, MRN [id number]  PCP:  Izell Riverton, MD  Cardiologist:   Madilyn Hook, MD   Chief Complaint  Patient presents with  . New Evaluation    cardiac clearance - possible hernia repair, not yet scheduled. patient reports no cardiac complaints. patient did have oral surgery 5 days ago and has excessive bruising and is very concerned, patient has been off aspirin for a month.      History of Present Illness: Mark Chase is a 64 y.o. male with DM type II,  HTN and hypertriglyceridemia who presents for presurgical evaluation.  Mark Chase is preparing to undergo umbilical hernia surgery. He was seen by Dr. Gaynelle Adu and referred for presurgical evaluation due to cardiovascular risk factors. Mark Chase reports shortness of breath after walking several blocks. He is able to walk up a flight of stairs without shortness of breath. His dyspnea has increased slowly over time which he attributes to weight gain. He denies chest pain or pressure. He also denies nausea vomiting or diaphoresis.  Mark Chase has had similar symptoms for similar years and was evaluated for this dyspnea and 2013. At that time he underwent echocardiography that revealed aortic valve calcifications and nuclear stress testing that was negative for ischemia. His symptoms have not changed since that time. He notes occasional lower extremity edema in his feet that improves by morning. He denies orthopnea PND or weight change.   [name] had dentures implanted 5 days go.  Since then he notes significant bruising.  There was also a lot of swelling, though this has improved significantly.  Mark Chase is Brother has heart disease and his father had a myocardial infarction at age 21.  Past Medical History  Diagnosis Date  . Arthritis   . Hyperlipidemia   . Diabetes mellitus   . Hypertension     Past Surgical History  Procedure Laterality Date  .  Shoulder surgery      Right     Current Outpatient Prescriptions  Medication Sig Dispense Refill  . canagliflozin (INVOKANA) 300 MG TABS tablet Take 300 mg by mouth daily.    . fenofibrate (TRICOR) 145 MG tablet Take 145 mg by mouth daily.    Marland Kitchen gabapentin (NEURONTIN) 300 MG capsule Take 300 mg by mouth 2 (two) times daily.    Marland Kitchen glipiZIDE (GLUCOTROL XL) 5 MG 24 hr tablet Take 5 mg by mouth daily with breakfast.    . losartan (COZAAR) 50 MG tablet Take 50 mg by mouth daily.    . metFORMIN (GLUCOPHAGE) 1000 MG tablet Take 1,000 mg by mouth 2 (two) times daily with a meal.    . pravastatin (PRAVACHOL) 40 MG tablet Take 40 mg by mouth daily.    Marland Kitchen aspirin 81 MG tablet Take 81 mg by mouth daily.     No current facility-administered medications for this visit.    Allergies:   Lisinopril    Social History:  The patient  reports that he has never smoked. His smokeless tobacco use includes Chew. He reports that he drinks alcohol.   Family History:  The patient's family history includes Coronary artery disease (age of onset: 29) in his father; Hypertension (age of onset: 42) in his mother.    ROS:  Please see the history of present illness.   Otherwise, review of systems are positive for none.   All other systems are reviewed and negative.  PHYSICAL EXAM: VS:  BP 136/80 mmHg  Pulse 76  Ht  (1.676 m)  Wt 77.157 kg (170 lb 1.6 oz)  BMI 27.47 kg/m2 , BMI Body mass index is 27.47 kg/(m^2). GENERAL:  Well appearing HEENT:  Pupils equal round and reactive, fundi not visualized, Newly implanted dentures with significant ecchymoses of the lower mandible NECK:  No jugular venous distention, waveform within normal limits, carotid upstroke brisk and symmetric, no bruits, no thyromegaly LYMPHATICS:  No cervical adenopathy LUNGS:  Clear to auscultation bilaterally HEART:  RRR.  PMI not displaced or sustained,S1 and S2 within normal limits, no S3, no S4, no clicks, no rubs, no murmurs ABD:   Flat, positive bowel sounds normal in frequency in pitch, no bruits, no rebound, no guarding, no midline pulsatile mass, no hepatomegaly, no splenomegaly EXT:  2 plus pulses throughout, trace edema in bilateral feet, no cyanosis no clubbing SKIN:  No rashes no nodules NEURO:  Cranial nerves II through XII grossly intact, motor grossly intact throughout PSYCH:  Cognitively intact, oriented to person place and time    EKG:  EKG is ordered today. The ekg ordered today demonstrates sinus rhythm 76bpm.  L axis deviation.  Poor precordial R wave progression.   Recent Labs: No results found for requested labs within last 365 days.    Lipid Panel    Component Value Date/Time   CHOL 103 02/02/2012 0852   TRIG 107.0 02/02/2012 0852   HDL 21.80* 02/02/2012 0852   CHOLHDL 5 02/02/2012 0852   VLDL 21.4 02/02/2012 0852   LDLCALC 60 02/02/2012 0852      Wt Readings from Last 3 Encounters:  07/25/15 77.157 kg (170 lb 1.6 oz)  10/25/12 78.019 kg (172 lb)  02/02/12 77.111 kg (170 lb)      Other studies Reviewed: Additional studies/ records that were reviewed today include: outside records . Review of the above records demonstrates:  Please see elsewhere in the note.     ASSESSMENT AND PLAN:  # Presurgical assessment:  Mark Chase presents for evaluation prior to his umbilical hernia surgery under general anesthesia. The patient does not have any unstable cardiac conditions.  He underwent cardiac stress testing in 2013 with the same dyspnea symptoms and did have any inducible ischemia.  Upon evaluation today, he can achieve 4 METs or greater without anginal symptoms.  According to [facility] and AHA guidelines, he requires no further cardiac workup prior to his noncardiac surgery and should be at acceptable risk.  We will assess his renal function in order to determine his NSQIP risk.  Assuming that this is acceptable, he should be able to pursue surgery without additional testing.  Our service is  available as necessary in the perioperative period.  # HTN: BP well-controlled.  Continue losartan.  # HL: Lipids well-controlled.  Agree with statin therapy.   Current medicines are reviewed at length with the patient today.  The patient does not have concerns regarding medicines.  The following changes have been made:  no change  Labs/ tests ordered today include: BMP  Orders Placed This Encounter  Procedures  . Basic metabolic panel  . EKG 12-Lead     Disposition:   FU prn     Signed, Madilyn Hook, MD  07/25/2015 4:44 PM    [facility]

## 2015-07-28 ENCOUNTER — Telehealth: Payer: Self-pay | Admitting: *Deleted

## 2015-07-28 NOTE — Telephone Encounter (Signed)
-----   Message from Chilton Si, MD sent at 07/28/2015 12:34 PM EDT ----- Please let Mr. Spano know that his kidney function is normal.  He is at low risk for his surgery and does not need additional testing.

## 2015-07-28 NOTE — Telephone Encounter (Signed)
LEFT MESSAGE  TO CALL BACK -- IN REGARDS TO LABS 

## 2015-07-28 NOTE — Telephone Encounter (Signed)
Spoke to [employer]. Result given . Verbalized understanding

## 2015-07-28 NOTE — Telephone Encounter (Signed)
-----   Message from Tiffany Tooele, MD sent at 07/28/2015 12:34 PM EDT ----- Please let Mark Chase know that his kidney function is normal.  He is at low risk for his surgery and does not need additional testing. 

## 2015-08-12 ENCOUNTER — Ambulatory Visit: Payer: Self-pay | Admitting: General Surgery

## 2015-08-12 NOTE — H&P (Signed)
Claude Waldman 07/17/2015 10:09 AM Location: [facility] Patient #: [id number] DOB: August 29, 1951 Married / Language: Gujarati / Race: Asian Male  History of Present Illness Minerva Areola M. Iriel Nason MD; 07/17/2015 10:49 AM) Patient words: hernia.  The patient is a 64 year old male who presents with an umbilical hernia. He is referred Dr Delbert Harness for evaluation of an umbilical hernia. Unfortunately we did not know ahead of time that English was not his first language and therefore we had not arranged for an interpreter. The patient was able to get his daughter on the phone who served as an Equities trader. He appears to understand some [place]. I did offer the patient and his daughter to reschedule when we could have an interpreter present however they both declined. He states that he has had a bulge at his umbilicus for about 4 years. It really isn't getting any larger. It only causes him discomfort when it is pushed in. He denies any nausea, vomiting, diarrhea or constipation. He denies any abdominal surgery. He denies any chest pain, chest pressure, shortness of breath,. He does get winded with activity. He denies smoking. He denies alcohol. He denies melena or hematochezia. Review of systems?a comprehensive 12 point review of systems was performed and all systems are negative except for what is mentioned in the HPI   Problem List/Past Medical Atilano Ina, MD; 07/17/2015 10:50 AM) UMBILICAL HERNIA WITHOUT OBSTRUCTION AND WITHOUT GANGRENE (553.1  K42.9)  Other Problems Atilano Ina, MD; 07/17/2015 10:50 AM) Arthritis Diabetes Mellitus High blood pressure Hypercholesterolemia  Past Surgical History (Ammie Eversole, LPN; 0/45/4098 11:91 AM) Shoulder Surgery Right.  Diagnostic Studies History (Ammie Eversole, LPN; 4/78/2956 21:30 AM) Colonoscopy 5-10 years ago  Allergies (Ammie Eversole, LPN; 8/65/7846 96:29 AM) No Known Drug Allergies07/28/2016  Medication History  (Ammie Eversole, LPN; 05/17/4131 44:01 AM) Invokana (  Tablet, Oral) Active. Aspirin (  Tablet Chewable, Oral) Active. Tricor (  Tablet, Oral) Active. Neurontin (  Capsule, Oral) Active. GlipiZIDE (  Tablet, Oral) Active. MetFORMIN HCl (  Tablet, Oral) Active. Pravachol (  Tablet, Oral) Active. Medications Reconciled  Social History (Ammie Eversole, LPN; 0/27/2536 64:40 AM) Alcohol use Occasional alcohol use. Caffeine use Coffee, Tea. No drug use Tobacco use Never smoker.  Family History (Deon Pilling, LPN; 3/47/4259 56:38 AM) Diabetes Mellitus Brother, Father, Mother, Sister. Heart Disease Brother, Father, Mother, Sister. Heart disease in male family member before age 60 Heart disease in male family member before age 85 Hypertension Brother, Father, Mother, Sister.  Review of Systems (Ammie Eversole LPN; 7/56/4332 95:18 AM) Musculoskeletal Present- Back Pain, Joint Pain, Muscle Pain and Muscle Weakness. Not Present- Joint Stiffness and Swelling of Extremities.   Vitals (Ammie Eversole LPN; 8/41/6606 30:16 AM) 07/17/2015 10:10 AM Weight: 171.2 lb Height: 66.25in Body Surface Area: 1.91 m Body Mass Index: 27.42 kg/m Temp.: 97.90F(Oral)  Pulse: 88 (Regular)  BP: 132/70 (Sitting, Left Arm, Standard)    Physical Exam Minerva Areola M. Mariha Sleeper MD; 07/17/2015 10:44 AM) General Mental Status-Alert. General Appearance-Consistent with stated age. Hydration-Well hydrated. Voice-Normal.  Head and Neck Head-normocephalic, atraumatic with no lesions or palpable masses. Trachea-midline. Thyroid Gland Characteristics - normal size and consistency.  Eye Eyeball - Bilateral-Extraocular movements intact. Sclera/Conjunctiva - Bilateral-No scleral icterus.  Chest and Lung Exam Chest and lung exam reveals -quiet, even and easy respiratory effort with no use of accessory muscles and on auscultation, normal breath  sounds, no adventitious sounds and normal vocal resonance. Inspection Chest Wall - Normal. Back - normal.  Breast - Did not examine.  Cardiovascular Cardiovascular  examination reveals -normal heart sounds, regular rate and rhythm with no murmurs and normal pedal pulses bilaterally.  Abdomen Inspection  Inspection of the abdomen reveals: Note: obvious umbilical bulge , reducible, soft, nt, nd; protuberant abd; defect about 2cm. Skin - Scar - no surgical scars. Palpation/Percussion Palpation and Percussion of the abdomen reveal - Soft, Non Tender, No Rebound tenderness, No Rigidity (guarding) and No hepatosplenomegaly. Auscultation Auscultation of the abdomen reveals - Bowel sounds normal.  Peripheral Vascular Upper Extremity Palpation - Pulses bilaterally normal.  Neurologic Neurologic evaluation reveals -alert and oriented x 3 with no impairment of recent or remote memory. Mental Status-Normal.  Neuropsychiatric The patient's mood and affect are described as -normal. Judgment and Insight-insight is appropriate concerning matters relevant to self.  Musculoskeletal Normal Exam - Left-Upper Extremity Strength Normal and Lower Extremity Strength Normal. Normal Exam - Right-Upper Extremity Strength Normal and Lower Extremity Strength Normal.  Lymphatic Head & Neck  General Head & Neck Lymphatics: Bilateral - Description - Normal. Axillary - Did not examine. Femoral & Inguinal - Did not examine.    Assessment & Plan Minerva Areola M. Sherral Dirocco MD; 07/17/2015 10:50 AM) UMBILICAL HERNIA WITHOUT OBSTRUCTION AND WITHOUT GANGRENE (553.1  K42.9) Impression: He has an umbilical hernia. It is reducible. We discussed observation versus surgical repair. We discussed the pros and cons of each.  We discussed the etiology of umbilical hernias. We discussed the signs and symptoms of incarceration and strangulation. The patient was given educational material.  With respect to operative  management, we discussed open repair  We discussed the risk and benefits of surgery including but not limited to bleeding, infection, injury to surrounding structures, hernia recurrence, mesh complications, hematoma/seroma formation, blood clot formation, urinary retention, post operative ileus, general anesthesia risk, abdominal pain. We discussed the importance of avoiding heavy lifting and straining for a period of 4- 6 weeks.  The patient has elected to proceed with OPEN REPAIR OF UMBILICAL HERNIA, POSSIBLE MESH. Because of his cardiac history he will need to have cardiac clearance prior to scheduled surgery. The daughter voice understanding. Our office will contact him to schedule surgery when we have obtain cardiac clearance Current Plans  Pt Education - Pamphlet Given - Hernia Surgery: discussed with patient and provided information. TYPE 2 DIABETES MELLITUS TREATED WITHOUT INSULIN (250.00  E11.9) HYPERCHOLESTEROLEMIA (272.0  E78.0) ESSENTIAL HYPERTENSION (401.9  I10)  [name]. Andrey Campanile, MD, FACS General, Bariatric, & Minimally Invasive Surgery [facility], [place]

## 2015-08-26 NOTE — Patient Instructions (Addendum)
YOUR PROCEDURE IS SCHEDULED ON :  08/28/15  REPORT TO [facility] MAIN ENTR[facility] FOLLOW SIGNS TO EAST ELEVATOR - GO TO 3rd FLOOR CHECK IN AT 3 EAST NURSES STATION (SHORT STAY) AT:  7:30 AM  CALL THIS NUMBER IF YOU HAVE PROBLEMS THE MORNING OF SURGERY [phone]  REMEMBER:ONLY 1 PER PERSON MAY GO TO SHORT STAY WITH YOU TO GET READY THE MORNING OF YOUR SURGERY  DO NOT EAT FOOD OR DRINK LIQUIDS AFTER MIDNIGHT  TAKE THESE MEDICINES THE MORNING OF SURGERY: NONE  YOU MAY NOT HAVE ANY METAL ON YOUR BODY INCLUDING HAIR PINS AND PIERCING'S. DO NOT WEAR JEWELRY, MAKEUP, LOTIONS, POWDERS OR PERFUMES. DO NOT WEAR NAIL POLISH. DO NOT SHAVE 48 HRS PRIOR TO SURGERY. MEN MAY SHAVE FACE AND NECK.  DO NOT BRING VALUABLES TO HOSPITAL. [facility] IS NOT RESPONSIBLE FOR VALUABLES.  CONTACTS, DENTURES OR PARTIALS MAY NOT BE WORN TO SURGERY. LEAVE SUITCASE IN CAR. CAN BE BROUGHT TO ROOM AFTER SURGERY.  PATIENTS DISCHARGED THE DAY OF SURGERY WILL NOT BE ALLOWED TO DRIVE HOME.  PLEASE READ OVER THE FOLLOWING INSTRUCTION SHEETS _________________________________________________________________________________                                          [facility] - PREPARING FOR SURGERY  Before surgery, you can play an important role.  Because skin is not sterile, your skin needs to be as free of germs as possible.  You can reduce the number of germs on your skin by washing with CHG (chlorahexidine gluconate) soap before surgery.  CHG is an antiseptic cleaner which kills germs and bonds with the skin to continue killing germs even after washing. Please DO NOT use if you have an allergy to CHG or antibacterial soaps.  If your skin becomes reddened/irritated stop using the CHG and inform your nurse when you arrive at Short Stay. Do not shave (including legs and underarms) for at least 48 hours prior to the first CHG shower.  You may shave your face. Please follow these instructions  carefully:   1.  Shower with CHG Soap the night before surgery and the  morning of Surgery.   2.  If you choose to wash your hair, wash your hair first as usual with your  normal  Shampoo.   3.  After you shampoo, rinse your hair and body thoroughly to remove the  shampoo.                                         4.  Use CHG as you would any other liquid soap.  You can apply chg directly  to the skin and wash . Gently wash with scrungie or clean wascloth    5.  Apply the CHG Soap to your body ONLY FROM THE NECK DOWN.   Do not use on open                           Wound or open sores. Avoid contact with eyes, ears mouth and genitals (private parts).                        Genitals (private parts) with your normal soap.  6.  Wash thoroughly, paying special attention to the area where your surgery  will be performed.   7.  Thoroughly rinse your body with warm water from the neck down.   8.  DO NOT shower/wash with your normal soap after using and rinsing off  the CHG Soap .                9.  Pat yourself dry with a clean towel.             10.  Wear clean night clothes to bed after shower             11.  Place clean sheets on your bed the night of your first shower and do not  sleep with pets.  Day of Surgery : Do not apply any lotions/deodorants the morning of surgery.  Please wear clean clothes to the hospital/surgery center.  FAILURE TO FOLLOW THESE INSTRUCTIONS MAY RESULT IN THE CANCELLATION OF YOUR SURGERY    PATIENT SIGNATURE_________________________________  ______________________________________________________________________

## 2015-08-27 ENCOUNTER — Encounter (HOSPITAL_COMMUNITY)
Admission: RE | Admit: 2015-08-27 | Discharge: 2015-08-27 | Disposition: A | Discharge status: Home / Self Care | Payer: Medicaid Other | Source: Ambulatory Visit | Attending: General Surgery | Admitting: General Surgery

## 2015-08-27 ENCOUNTER — Encounter (HOSPITAL_COMMUNITY): Payer: Self-pay

## 2015-08-27 DIAGNOSIS — Z01812 Encounter for preprocedural laboratory examination: Secondary | ICD-10-CM | POA: Insufficient documentation

## 2015-08-27 DIAGNOSIS — K429 Umbilical hernia without obstruction or gangrene: Secondary | ICD-10-CM | POA: Insufficient documentation

## 2015-08-27 HISTORY — DX: Umbilical hernia without obstruction or gangrene: K42.9

## 2015-08-27 LAB — BASIC METABOLIC PANEL
Anion gap: 9 (ref 5–15)
BUN: 14 mg/dL (ref 6–20)
CO2: 25 mmol/L (ref 22–32)
CREATININE: 1.01 mg/dL (ref 0.61–1.24)
Calcium: 9.7 mg/dL (ref 8.9–10.3)
Chloride: 104 mmol/L (ref 101–111)
GFR calc non Af Amer: >60 mL/min (ref >60)
Glucose, Bld: 140 mg/dL — ABNORMAL HIGH (ref 65–99)
Potassium: 4.3 mmol/L (ref 3.5–5.1)
SODIUM: 138 mmol/L (ref 135–145)

## 2015-08-27 LAB — CBC
HCT: 41.3 % (ref 39.0–52.0)
Hemoglobin: 14.2 g/dL (ref 13.0–17.0)
MCH: 29.3 pg (ref 26.0–34.0)
MCHC: 34.4 g/dL (ref 30.0–36.0)
MCV: 85.3 fL (ref 78.0–100.0)
Platelets: 112 10*3/uL — ABNORMAL LOW (ref 150–400)
RBC: 4.84 MIL/uL (ref 4.22–5.81)
RDW: 14.4 % (ref 11.5–15.5)
WBC: 4.5 10*3/uL (ref 4.0–10.5)

## 2015-08-27 NOTE — Progress Notes (Signed)
CBC faxed to Dr. Andrey Campanile

## 2015-08-28 ENCOUNTER — Encounter (HOSPITAL_COMMUNITY): Payer: Self-pay | Admitting: *Deleted

## 2015-08-28 ENCOUNTER — Ambulatory Visit (HOSPITAL_COMMUNITY)
Admission: RE | Admit: 2015-08-28 | Discharge: 2015-08-28 | Disposition: A | Discharge status: Home / Self Care | Payer: Medicaid Other | Source: Ambulatory Visit | Attending: General Surgery | Admitting: General Surgery

## 2015-08-28 ENCOUNTER — Ambulatory Visit (HOSPITAL_COMMUNITY): Payer: Medicaid Other | Admitting: Anesthesiology

## 2015-08-28 ENCOUNTER — Encounter (HOSPITAL_COMMUNITY)
Admission: RE | Disposition: A | Discharge status: Home / Self Care | Payer: Self-pay | Source: Ambulatory Visit | Attending: General Surgery

## 2015-08-28 DIAGNOSIS — Z7982 Long term (current) use of aspirin: Secondary | ICD-10-CM | POA: Diagnosis not present

## 2015-08-28 DIAGNOSIS — Z888 Allergy status to other drugs, medicaments and biological substances status: Secondary | ICD-10-CM | POA: Insufficient documentation

## 2015-08-28 DIAGNOSIS — R0602 Shortness of breath: Secondary | ICD-10-CM | POA: Diagnosis not present

## 2015-08-28 DIAGNOSIS — I10 Essential (primary) hypertension: Secondary | ICD-10-CM | POA: Insufficient documentation

## 2015-08-28 DIAGNOSIS — E119 Type 2 diabetes mellitus without complications: Secondary | ICD-10-CM | POA: Insufficient documentation

## 2015-08-28 DIAGNOSIS — E780 Pure hypercholesterolemia: Secondary | ICD-10-CM | POA: Diagnosis not present

## 2015-08-28 DIAGNOSIS — M1990 Unspecified osteoarthritis, unspecified site: Secondary | ICD-10-CM | POA: Diagnosis not present

## 2015-08-28 DIAGNOSIS — K429 Umbilical hernia without obstruction or gangrene: Secondary | ICD-10-CM | POA: Diagnosis present

## 2015-08-28 HISTORY — PX: UMBILICAL HERNIA REPAIR: SHX196

## 2015-08-28 HISTORY — PX: INSERTION OF MESH: SHX5868

## 2015-08-28 LAB — GLUCOSE, CAPILLARY: Glucose-Capillary: 122 mg/dL — ABNORMAL HIGH (ref 65–99)

## 2015-08-28 SURGERY — REPAIR, HERNIA, UMBILICAL, ADULT
Anesthesia: General | Site: Abdomen

## 2015-08-28 MED ORDER — CHLORHEXIDINE GLUCONATE 4 % EX LIQD: 1.0000 "application " | Freq: Once | CUTANEOUS | Status: DC

## 2015-08-28 MED ORDER — ONDANSETRON HCL 4 MG/2ML IJ SOLN
INTRAMUSCULAR | Status: DC | PRN
  Administered 2015-08-28: 4 mg via INTRAVENOUS

## 2015-08-28 MED ORDER — PROPOFOL 10 MG/ML IV BOLUS
INTRAVENOUS | Status: AC
  Filled 2015-08-28: qty 20

## 2015-08-28 MED ORDER — ROCURONIUM BROMIDE 100 MG/10ML IV SOLN
INTRAVENOUS | Status: DC | PRN
  Administered 2015-08-28: 40 mg via INTRAVENOUS

## 2015-08-28 MED ORDER — GLYCOPYRROLATE 0.2 MG/ML IJ SOLN
INTRAMUSCULAR | Status: AC
  Filled 2015-08-28: qty 3

## 2015-08-28 MED ORDER — SODIUM CHLORIDE 0.9 % IV SOLN: 250.0000 mL | INTRAVENOUS | Status: DC | PRN

## 2015-08-28 MED ORDER — ROCURONIUM BROMIDE 100 MG/10ML IV SOLN
INTRAVENOUS | Status: AC
  Filled 2015-08-28: qty 1

## 2015-08-28 MED ORDER — FENTANYL CITRATE (PF) 100 MCG/2ML IJ SOLN
INTRAMUSCULAR | Status: DC | PRN
  Administered 2015-08-28: 100 ug via INTRAVENOUS

## 2015-08-28 MED ORDER — BUPIVACAINE-EPINEPHRINE 0.25% -1:200000 IJ SOLN
INTRAMUSCULAR | Status: DC | PRN
  Administered 2015-08-28: 4 mL

## 2015-08-28 MED ORDER — SODIUM CHLORIDE 0.9 % IJ SOLN: 3.0000 mL | Freq: Two times a day (BID) | INTRAMUSCULAR | Status: DC

## 2015-08-28 MED ORDER — BUPIVACAINE-EPINEPHRINE 0.25% -1:200000 IJ SOLN
INTRAMUSCULAR | Status: AC
  Filled 2015-08-28: qty 1

## 2015-08-28 MED ORDER — PROPOFOL 10 MG/ML IV BOLUS
INTRAVENOUS | Status: DC | PRN
  Administered 2015-08-28: 150 mg via INTRAVENOUS

## 2015-08-28 MED ORDER — ACETAMINOPHEN 500 MG PO TABS
1000.0000 mg | ORAL_TABLET | Freq: Four times a day (QID) | ORAL | Status: DC
  Filled 2015-08-28 (×4): qty 2

## 2015-08-28 MED ORDER — 0.9 % SODIUM CHLORIDE (POUR BTL) OPTIME
TOPICAL | Status: DC | PRN
  Administered 2015-08-28: 1000 mL

## 2015-08-28 MED ORDER — CEFAZOLIN SODIUM-DEXTROSE 2-3 GM-% IV SOLR
2.0000 g | INTRAVENOUS | Status: AC
Start: 2015-08-28 — End: 2015-08-28
  Administered 2015-08-28: 2 g via INTRAVENOUS

## 2015-08-28 MED ORDER — NEOSTIGMINE METHYLSULFATE 10 MG/10ML IV SOLN
INTRAVENOUS | Status: AC
  Filled 2015-08-28: qty 1

## 2015-08-28 MED ORDER — LIDOCAINE HCL (CARDIAC) 20 MG/ML IV SOLN
INTRAVENOUS | Status: DC | PRN
  Administered 2015-08-28: 50 mg via INTRAVENOUS

## 2015-08-28 MED ORDER — CHLORHEXIDINE GLUCONATE 4 % EX LIQD
1.0000 | Freq: Once | CUTANEOUS | Status: DC
Start: 2015-08-28 — End: 2015-08-28

## 2015-08-28 MED ORDER — MIDAZOLAM HCL 2 MG/2ML IJ SOLN
INTRAMUSCULAR | Status: AC
  Filled 2015-08-28: qty 4

## 2015-08-28 MED ORDER — GLYCOPYRROLATE 0.2 MG/ML IJ SOLN
INTRAMUSCULAR | Status: DC | PRN
  Administered 2015-08-28: 0.6 mg via INTRAVENOUS

## 2015-08-28 MED ORDER — LIDOCAINE HCL (CARDIAC) 20 MG/ML IV SOLN
INTRAVENOUS | Status: AC
  Filled 2015-08-28: qty 5

## 2015-08-28 MED ORDER — ACETAMINOPHEN 325 MG PO TABS: 650.0000 mg | ORAL_TABLET | ORAL | Status: DC | PRN

## 2015-08-28 MED ORDER — ACETAMINOPHEN 650 MG RE SUPP
650.0000 mg | RECTAL | Status: DC | PRN
  Filled 2015-08-28: qty 1

## 2015-08-28 MED ORDER — NEOSTIGMINE METHYLSULFATE 10 MG/10ML IV SOLN
INTRAVENOUS | Status: DC | PRN
  Administered 2015-08-28: 4 mg via INTRAVENOUS

## 2015-08-28 MED ORDER — FENTANYL CITRATE (PF) 250 MCG/5ML IJ SOLN
INTRAMUSCULAR | Status: AC
  Filled 2015-08-28: qty 25

## 2015-08-28 MED ORDER — LACTATED RINGERS IV SOLN
INTRAVENOUS | Status: DC
  Administered 2015-08-28: 1000 mL via INTRAVENOUS
  Administered 2015-08-28: 09:00:00 via INTRAVENOUS

## 2015-08-28 MED ORDER — FENTANYL CITRATE (PF) 100 MCG/2ML IJ SOLN
INTRAMUSCULAR | Status: AC
  Administered 2015-08-28: 25 ug via INTRAVENOUS
  Filled 2015-08-28: qty 2

## 2015-08-28 MED ORDER — LACTATED RINGERS IV SOLN: INTRAVENOUS | Status: DC

## 2015-08-28 MED ORDER — ONDANSETRON HCL 4 MG/2ML IJ SOLN
INTRAMUSCULAR | Status: AC
  Filled 2015-08-28: qty 2

## 2015-08-28 MED ORDER — FENTANYL CITRATE (PF) 100 MCG/2ML IJ SOLN
25.0000 ug | INTRAMUSCULAR | Status: DC | PRN
  Administered 2015-08-28: 25 ug via INTRAVENOUS

## 2015-08-28 MED ORDER — OXYCODONE HCL 5 MG PO TABS
5.0000 mg | ORAL_TABLET | ORAL | Status: DC | PRN
Start: 2015-08-28 — End: 2015-08-28

## 2015-08-28 MED ORDER — CEFAZOLIN SODIUM-DEXTROSE 2-3 GM-% IV SOLR
INTRAVENOUS | Status: AC
  Filled 2015-08-28: qty 50

## 2015-08-28 MED ORDER — MIDAZOLAM HCL 5 MG/5ML IJ SOLN
INTRAMUSCULAR | Status: DC | PRN
  Administered 2015-08-28: 2 mg via INTRAVENOUS

## 2015-08-28 MED ORDER — BUPIVACAINE LIPOSOME 1.3 % IJ SUSP
20.0000 mL | Freq: Once | INTRAMUSCULAR | Status: AC
  Administered 2015-08-28: 20 mL
  Filled 2015-08-28: qty 20

## 2015-08-28 MED ORDER — FENTANYL CITRATE (PF) 100 MCG/2ML IJ SOLN: 25.0000 ug | INTRAMUSCULAR | Status: DC | PRN

## 2015-08-28 MED ORDER — SODIUM CHLORIDE 0.9 % IJ SOLN: 3.0000 mL | INTRAMUSCULAR | Status: DC | PRN

## 2015-08-28 MED ORDER — OXYCODONE HCL 5 MG PO TABS: 5.0000 mg | ORAL_TABLET | ORAL | Status: DC | PRN

## 2015-08-28 SURGICAL SUPPLY — 49 items
APL SKNCLS STERI-STRIP NONHPOA (GAUZE/BANDAGES/DRESSINGS) ×1
BENZOIN TINCTURE PRP APPL 2/3 (GAUZE/BANDAGES/DRESSINGS) ×2 IMPLANT
BINDER ABDOMINAL 12 ML 46-62 (SOFTGOODS) ×2 IMPLANT
BLADE HEX COATED 2.75 (ELECTRODE) ×1 IMPLANT
BLADE SURG 15 STRL LF DISP TIS (BLADE) IMPLANT
BLADE SURG 15 STRL SS (BLADE)
BLADE SURG SZ10 CARB STEEL (BLADE) ×3 IMPLANT
CLOSURE WOUND 1/2 X4 (GAUZE/BANDAGES/DRESSINGS)
COVER SURGICAL LIGHT HANDLE (MISCELLANEOUS) ×1 IMPLANT
DECANTER SPIKE VIAL GLASS SM (MISCELLANEOUS) ×1 IMPLANT
DRAIN PENROSE 18X1/2 LTX STRL (DRAIN) ×1 IMPLANT
DRAPE LAPAROSCOPIC ABDOMINAL (DRAPES) ×3 IMPLANT
DRAPE UTILITY XL STRL (DRAPES) ×3 IMPLANT
ELECT PENCIL ROCKER SW 15FT (MISCELLANEOUS) ×3 IMPLANT
ELECT REM PT RETURN 9FT ADLT (ELECTROSURGICAL) ×3
ELECTRODE REM PT RTRN 9FT ADLT (ELECTROSURGICAL) ×1 IMPLANT
GAUZE PACKING 2X5 YD STRL (GAUZE/BANDAGES/DRESSINGS) ×2 IMPLANT
GAUZE SPONGE 4X4 12PLY STRL (GAUZE/BANDAGES/DRESSINGS) IMPLANT
GLOVE BIO SURGEON STRL SZ7 (GLOVE) ×3 IMPLANT
GLOVE BIO SURGEON STRL SZ7.5 (GLOVE) ×3 IMPLANT
GLOVE BIOGEL M STRL SZ7.5 (GLOVE) ×2 IMPLANT
GLOVE BIOGEL PI IND STRL 7.0 (GLOVE) ×1 IMPLANT
GLOVE BIOGEL PI INDICATOR 7.0 (GLOVE) ×2
GLOVE INDICATOR 8.0 STRL GRN (GLOVE) ×6 IMPLANT
GOWN STRL REUS W/TWL LRG LVL3 (GOWN DISPOSABLE) ×3 IMPLANT
GOWN STRL REUS W/TWL XL LVL3 (GOWN DISPOSABLE) ×4 IMPLANT
KIT BASIN OR (CUSTOM PROCEDURE TRAY) ×3 IMPLANT
LIQUID BAND (GAUZE/BANDAGES/DRESSINGS) IMPLANT
MESH VENTRALEX ST 8CM LRG (Mesh General) ×2 IMPLANT
NDL HYPO 25X1 1.5 SAFETY (NEEDLE) ×1 IMPLANT
NEEDLE HYPO 25X1 1.5 SAFETY (NEEDLE) ×3 IMPLANT
NS IRRIG 1000ML POUR BTL (IV SOLUTION) ×3 IMPLANT
PACK BASIC VI WITH GOWN DISP (CUSTOM PROCEDURE TRAY) ×3 IMPLANT
SPONGE LAP 18X18 X RAY DECT (DISPOSABLE) ×1 IMPLANT
STRIP CLOSURE SKIN 1/2X4 (GAUZE/BANDAGES/DRESSINGS) IMPLANT
SUT MNCRL AB 4-0 PS2 18 (SUTURE) ×3 IMPLANT
SUT NOVA 1 T20/GS 25DT (SUTURE) ×4 IMPLANT
SUT SILK 2 0 SH (SUTURE) IMPLANT
SUT VIC AB 2-0 SH 27 (SUTURE) ×3
SUT VIC AB 2-0 SH 27X BRD (SUTURE) ×2 IMPLANT
SUT VIC AB 3-0 SH 18 (SUTURE) ×2 IMPLANT
SUT VIC AB 3-0 SH 27 (SUTURE) ×3
SUT VIC AB 3-0 SH 27XBRD (SUTURE) ×1 IMPLANT
SYR BULB IRRIGATION 50ML (SYRINGE) ×3 IMPLANT
SYR CONTROL 10ML LL (SYRINGE) ×3 IMPLANT
TAPE CLOTH SOFT 2X10 (GAUZE/BANDAGES/DRESSINGS) ×2 IMPLANT
TAPE STRIPS DRAPE STRL (GAUZE/BANDAGES/DRESSINGS) ×2 IMPLANT
TOWEL OR 17X26 10 PK STRL BLUE (TOWEL DISPOSABLE) ×3 IMPLANT
YANKAUER SUCT BULB TIP 10FT TU (MISCELLANEOUS) ×3 IMPLANT

## 2015-08-28 NOTE — Anesthesia Procedure Notes (Signed)
Procedure Name: Intubation Date/Time: 08/28/2015 9:47 AM Performed by: Thornell Mule Pre-anesthesia Checklist: Patient identified, Emergency Drugs available, Suction available and Patient being monitored Patient Re-evaluated:Patient Re-evaluated prior to inductionOxygen Delivery Method: Circle System Utilized Preoxygenation: Pre-oxygenation with 100% oxygen Intubation Type: IV induction Ventilation: Mask ventilation without difficulty Laryngoscope Size: Miller and 3 Grade View: Grade I Tube type: Oral Tube size: 7.5 mm Number of attempts: 1 Airway Equipment and Method: Stylet and Oral airway Placement Confirmation: ETT inserted through vocal cords under direct vision,  positive ETCO2 and breath sounds checked- equal and bilateral Secured at: 21 cm Tube secured with: Tape Dental Injury: Teeth and Oropharynx as per pre-operative assessment

## 2015-08-28 NOTE — H&P (View-Only) (Signed)
Mark Chase 07/17/2015 10:09 AM Location: [facility] Patient #: [id number] DOB: August 29, 1951 Married / Language: Gujarati / Race: Asian Male  History of Present Illness Mark Areola M. Austine Wiedeman MD; 07/17/2015 10:49 AM) Patient words: hernia.  The patient is a 64 year old male who presents with an umbilical hernia. He is referred Dr Mark Chase for evaluation of an umbilical hernia. Unfortunately we did not know ahead of time that English was not his first language and therefore we had not arranged for an interpreter. The patient was able to get his daughter on the phone who served as an Equities trader. He appears to understand some [place]. I did offer the patient and his daughter to reschedule when we could have an interpreter present however they both declined. He states that he has had a bulge at his umbilicus for about 4 years. It really isn't getting any larger. It only causes him discomfort when it is pushed in. He denies any nausea, vomiting, diarrhea or constipation. He denies any abdominal surgery. He denies any chest pain, chest pressure, shortness of breath,. He does get winded with activity. He denies smoking. He denies alcohol. He denies melena or hematochezia. Review of systems?a comprehensive 12 point review of systems was performed and all systems are negative except for what is mentioned in the HPI   Problem List/Past Medical Mark Ina, MD; 07/17/2015 10:50 AM) UMBILICAL HERNIA WITHOUT OBSTRUCTION AND WITHOUT GANGRENE (553.1  K42.9)  Other Problems Mark Ina, MD; 07/17/2015 10:50 AM) Arthritis Diabetes Mellitus High blood pressure Hypercholesterolemia  Past Surgical History (Mark Eversole, LPN; 0/45/4098 11:91 AM) Shoulder Surgery Right.  Diagnostic Studies History (Mark Eversole, LPN; 4/78/2956 21:30 AM) Colonoscopy 5-10 years ago  Allergies (Mark Eversole, LPN; 8/65/7846 96:29 AM) No Known Drug Allergies07/28/2016  Medication History  (Mark Eversole, LPN; 05/17/4131 44:01 AM) Invokana (  Tablet, Oral) Active. Aspirin (  Tablet Chewable, Oral) Active. Tricor (  Tablet, Oral) Active. Neurontin (  Capsule, Oral) Active. GlipiZIDE (  Tablet, Oral) Active. MetFORMIN HCl (  Tablet, Oral) Active. Pravachol (  Tablet, Oral) Active. Medications Reconciled  Social History (Mark Eversole, LPN; 0/27/2536 64:40 AM) Alcohol use Occasional alcohol use. Caffeine use Coffee, Tea. No drug use Tobacco use Never smoker.  Family History (Mark Pilling, LPN; 3/47/4259 56:38 AM) Diabetes Mellitus Brother, Father, Mother, Sister. Heart Disease Brother, Father, Mother, Sister. Heart disease in male family member before age 60 Heart disease in male family member before age 85 Hypertension Brother, Father, Mother, Sister.  Review of Systems (Mark Eversole LPN; 7/56/4332 95:18 AM) Musculoskeletal Present- Back Pain, Joint Pain, Muscle Pain and Muscle Weakness. Not Present- Joint Stiffness and Swelling of Extremities.   Vitals (Mark Eversole LPN; 8/41/6606 30:16 AM) 07/17/2015 10:10 AM Weight: 171.2 lb Height: 66.25in Body Surface Area: 1.91 m Body Mass Index: 27.42 kg/m Temp.: 97.90F(Oral)  Pulse: 88 (Regular)  BP: 132/70 (Sitting, Left Arm, Standard)    Physical Exam Mark Areola M. Kimie Pidcock MD; 07/17/2015 10:44 AM) General Mental Status-Alert. General Appearance-Consistent with stated age. Hydration-Well hydrated. Voice-Normal.  Head and Neck Head-normocephalic, atraumatic with no lesions or palpable masses. Trachea-midline. Thyroid Gland Characteristics - normal size and consistency.  Eye Eyeball - Bilateral-Extraocular movements intact. Sclera/Conjunctiva - Bilateral-No scleral icterus.  Chest and Lung Exam Chest and lung exam reveals -quiet, even and easy respiratory effort with no use of accessory muscles and on auscultation, normal breath  sounds, no adventitious sounds and normal vocal resonance. Inspection Chest Wall - Normal. Back - normal.  Breast - Did not examine.  Cardiovascular Cardiovascular  examination reveals -normal heart sounds, regular rate and rhythm with no murmurs and normal pedal pulses bilaterally.  Abdomen Inspection  Inspection of the abdomen reveals: Note: obvious umbilical bulge , reducible, soft, nt, nd; protuberant abd; defect about 2cm. Skin - Scar - no surgical scars. Palpation/Percussion Palpation and Percussion of the abdomen reveal - Soft, Non Tender, No Rebound tenderness, No Rigidity (guarding) and No hepatosplenomegaly. Auscultation Auscultation of the abdomen reveals - Bowel sounds normal.  Peripheral Vascular Upper Extremity Palpation - Pulses bilaterally normal.  Neurologic Neurologic evaluation reveals -alert and oriented x 3 with no impairment of recent or remote memory. Mental Status-Normal.  Neuropsychiatric The patient's mood and affect are described as -normal. Judgment and Insight-insight is appropriate concerning matters relevant to self.  Musculoskeletal Normal Exam - Left-Upper Extremity Strength Normal and Lower Extremity Strength Normal. Normal Exam - Right-Upper Extremity Strength Normal and Lower Extremity Strength Normal.  Lymphatic Head & Neck  General Head & Neck Lymphatics: Bilateral - Description - Normal. Axillary - Did not examine. Femoral & Inguinal - Did not examine.    Assessment & Plan Mark Areola M. Zaxton Angerer MD; 07/17/2015 10:50 AM) UMBILICAL HERNIA WITHOUT OBSTRUCTION AND WITHOUT GANGRENE (553.1  K42.9) Impression: He has an umbilical hernia. It is reducible. We discussed observation versus surgical repair. We discussed the pros and cons of each.  We discussed the etiology of umbilical hernias. We discussed the signs and symptoms of incarceration and strangulation. The patient was given educational material.  With respect to operative  management, we discussed open repair  We discussed the risk and benefits of surgery including but not limited to bleeding, infection, injury to surrounding structures, hernia recurrence, mesh complications, hematoma/seroma formation, blood clot formation, urinary retention, post operative ileus, general anesthesia risk, abdominal pain. We discussed the importance of avoiding heavy lifting and straining for a period of 4- 6 weeks.  The patient has elected to proceed with OPEN REPAIR OF UMBILICAL HERNIA, POSSIBLE MESH. Because of his cardiac history he will need to have cardiac clearance prior to scheduled surgery. The daughter voice understanding. Our office will contact him to schedule surgery when we have obtain cardiac clearance Current Plans  Pt Education - Pamphlet Given - Hernia Surgery: discussed with patient and provided information. TYPE 2 DIABETES MELLITUS TREATED WITHOUT INSULIN (250.00  E11.9) HYPERCHOLESTEROLEMIA (272.0  E78.0) ESSENTIAL HYPERTENSION (401.9  I10)  [name]. Andrey Campanile, MD, FACS General, Bariatric, & Minimally Invasive Surgery [facility], [place]

## 2015-08-28 NOTE — Op Note (Signed)
Mark Chase [id number] December 08, 1951 08/28/2015  Umbilical Herniorrhaphy with Mesh Procedure Note  Indications: Symptomatic umbilical hernia   Pre-operative Diagnosis: umbilical hernia   Post-operative Diagnosis: umbilical hernia   Surgeon: [name]. Andrey Campanile, MD FACS   Assistants: none   Anesthesia: General endotracheal anesthesia and Local anesthesia 0.25%marcaine with epi +20cc exparel  ASA Class: 3  Procedure Details  The patient was seen in the Holding Room. The risks, benefits, complications, treatment options, and expected outcomes were discussed with the patient. The possibilities of reaction to medication, pulmonary aspiration, perforation of viscus, bleeding, recurrent infection, hernia recurrence, the need for additional procedures, failure to diagnose a condition, and creating a complication requiring transfusion or operation were discussed with the patient. The patient concurred with the proposed plan, giving informed consent. The site of surgery properly noted/marked.   The patient was taken to Operating Room # 1 at [facility], identified as Mark Chase and the procedure verified as Umbilical Herniorrhaphy with possible mesh. A Time Out was held and the above information confirmed. The patient received IV antibiotics prior to the procedure.   The patient was placed supine. After establishing general anesthesia, the abdomen was prepped and draped in standard fashion. 0.250% Marcaine with epinephrine was used to anesthetize the skin. A curvilinear infraumbilical incision was created. Dissection was carried down to the hernia sac located above the fascia and was mobilized from surrounding structures. Intact fascia was identified circumferentially around the defect. Skin and soft tissue was mobilized from the surface of the fascia in a circumferential manner. The defect was 2.5cm. I decided to enter the hernia sac and gain entry to the abdominal cavity for mesh placement.  A finger sweep  was performed underneath the fascia to ensure there were no adhesions to the anterior abdominal wall. A 8cm round Bard Ventralex ST patch was then placed thru the defect. The tabs were lifted to ensure that the mesh was flush against the abdominal wall. I then ran my finger around the inside of the defect to ensure nothing was trapped between the mesh and the abdominal wall. A 1-novafil suture was used to secure the base of each tab to the fascia and then the excess tab was trimmed at the fascial level. I then placed 1 additional 1-novafil sutures thru the mesh to the fascia. I closed the hernia sac over the mesh with 2 interrupted figure of eight 3-0 vicryl sutures. The fascia was then closed primarily over the mesh with 5 interrupted 1-0 novafil sutures. The cavity was irrigated and Exparel was infiltrated in the subcutaneous tissue and fascia. The umbilical stalk was then tacked back down to the fascia with two 3-0 vicryl sutures. Hemostasis was confirmed. The soft tissue was irrigated and closed in layers with inverted interrupted 3-0 vicryl sutures for the deep dermis. The skin incision was closed with a 4-0 monocryl subcuticular closure. Steri-Strips followed by a 4x4 gauze and a tegaderm were applied at the end of the operation.   Instrument, sponge, and needle counts were correct prior to closure and at the conclusion of the case.   Findings:  A 2.5 cm fascial defect; mesh beneath fascia inside peritoneal cavity; 8cm round Bard ventralex ST patch; 3cm overlap  Estimated Blood Loss: Minimal   Drains: none   Implants: see above   Complications: None; patient tolerated the procedure well.   Disposition: PACU - hemodynamically stable.   Condition: stable  [name]. Andrey Campanile, MD, FACS General, Bariatric, & Minimally Invasive Surgery [facility],  [place]

## 2015-08-28 NOTE — Interval H&P Note (Signed)
History and Physical Interval Note:  08/28/2015 9:13 AM  Mark Chase  has presented today for surgery, with the diagnosis of umbilical hernia  The various methods of treatment have been discussed with the patient and family. After consideration of risks, benefits and other options for treatment, the patient has consented to  Procedure(s): OPEN REPAIR UMBILICAL HERNIA (N/A) INSERTION OF MESH (N/A) as a surgical intervention .  The patient's history has been reviewed, patient examined, no change in status, stable for surgery.  I have reviewed the patient's chart and labs.  Questions were answered to the patient's satisfaction.    [name]. Andrey Campanile, MD, FACS General, Bariatric, & Minimally Invasive Surgery [facility], [place]    [facility]

## 2015-08-28 NOTE — Anesthesia Preprocedure Evaluation (Addendum)
Anesthesia Evaluation  Patient identified by MRN, date of birth, ID band Patient awake    Reviewed: Allergy & Precautions, H&P , NPO status , Patient's Chart, lab work & pertinent test results  Airway Mallampati: II  TM Distance: >3 FB Neck ROM: full    Dental no notable dental hx. (+) Dental Advisory Given   Pulmonary shortness of breath and with exertion,    Pulmonary exam normal breath sounds clear to auscultation       Cardiovascular Exercise Tolerance: Good hypertension, Pt. on medications Normal cardiovascular exam Rhythm:regular Rate:Normal     Neuro/Psych negative neurological ROS  negative psych ROS   GI/Hepatic negative GI ROS, Neg liver ROS,   Endo/Other  diabetes, Well Controlled, Type 2, Oral Hypoglycemic Agents  Renal/GU negative Renal ROS  negative genitourinary   Musculoskeletal   Abdominal   Peds  Hematology negative hematology ROS (+)   Anesthesia Other Findings   Reproductive/Obstetrics negative OB ROS                            Anesthesia Physical Anesthesia Plan  ASA: III  Anesthesia Plan: General   Post-op Pain Management:    Induction: Intravenous  Airway Management Planned: Oral ETT  Additional Equipment:   Intra-op Plan:   Post-operative Plan: Extubation in OR  Informed Consent: I have reviewed the patients History and Physical, chart, labs and discussed the procedure including the risks, benefits and alternatives for the proposed anesthesia with the patient or authorized representative who has indicated his/her understanding and acceptance.   Dental Advisory Given  Plan Discussed with: CRNA and Surgeon  Anesthesia Plan Comments:         Anesthesia Quick Evaluation

## 2015-08-28 NOTE — Transfer of Care (Signed)
Immediate Anesthesia Transfer of Care Note  Patient: Mark Chase  Procedure(s) Performed: Procedure(s): OPEN REPAIR UMBILICAL HERNIA (N/A) INSERTION OF MESH (N/A)  Patient Location: PACU  Anesthesia Type:General  Level of Consciousness: awake, alert  and patient cooperative  Airway & Oxygen Therapy: Patient Spontanous Breathing and Patient connected to face mask oxygen  Post-op Assessment: Report given to RN and Post -op Vital signs reviewed and stable  Post vital signs: Reviewed and stable  Last Vitals:  Filed Vitals:   08/28/15 0726  BP: 144/71  Pulse: 72  Temp: 36.5 C  Resp: 18    Complications: No apparent anesthesia complications

## 2015-08-28 NOTE — Anesthesia Postprocedure Evaluation (Signed)
  Anesthesia Post-op Note  Patient: Mark Chase  Procedure(s) Performed: Procedure(s) (LRB): OPEN REPAIR UMBILICAL HERNIA (N/A) INSERTION OF MESH (N/A)  Patient Location: PACU  Anesthesia Type: General  Level of Consciousness: awake and alert   Airway and Oxygen Therapy: Patient Spontanous Breathing  Post-op Pain: mild  Post-op Assessment: Post-op Vital signs reviewed, Patient's Cardiovascular Status Stable, Respiratory Function Stable, Patent Airway and No signs of Nausea or vomiting  Last Vitals:  Filed Vitals:   08/28/15 1211  BP: 121/70  Pulse: 64  Temp: 36.8 C  Resp: 15    Post-op Vital Signs: stable   Complications: No apparent anesthesia complications

## 2015-08-28 NOTE — Discharge Instructions (Signed)
[facility]  UMBILICAL OR INGUINAL HERNIA REPAIR: POST OP INSTRUCTIONS  Always review your discharge instruction sheet given to you by the facility where your surgery was performed. IF YOU HAVE DISABILITY OR FAMILY LEAVE FORMS, YOU MUST BRING THEM TO THE OFFICE FOR PROCESSING.   DO NOT GIVE THEM TO YOUR DOCTOR.  1. A  prescription for pain medication may be given to you upon discharge.  Take your pain medication as prescribed, if needed.  If narcotic pain medicine is not needed, then you may take acetaminophen (Tylenol) or ibuprofen (Advil) as needed. 2. Take your usually prescribed medications unless otherwise directed. 3. If you need a refill on your pain medication, please contact your pharmacy.  They will contact our office to request authorization. Prescriptions will not be filled after 5 pm or on week-ends. 4. You should follow a light diet the first 24 hours after arrival home, such as soup and crackers, etc.  Be sure to include lots of fluids daily.  Resume your normal diet the day after surgery. 5. Most patients will experience some swelling and bruising around the umbilicus or in the groin and scrotum.  Ice packs and reclining will help.  Swelling and bruising can take several days to resolve.  6. It is common to experience some constipation if taking pain medication after surgery.  Increasing fluid intake and taking a stool softener (such as Colace) will usually help or prevent this problem from occurring.  A mild laxative (Milk of Magnesia or Miralax) should be taken according to package directions if there are no bowel movements after 48 hours. 7. Unless discharge instructions indicate otherwise, you may remove your bandages48 hours after surgery, and you may shower at that time.  You  have steri-strips (small skin tapes) in place directly over the incision.  These strips should be left on the skin for 7-10 days.   Any sutures or staples will be removed at the office  during your follow-up visit. 8. ACTIVITIES:  You may resume regular (light) daily activities beginning the next day--such as daily self-care, walking, climbing stairs--gradually increasing activities as tolerated.  You may have sexual intercourse when it is comfortable.  Refrain from any heavy lifting or straining until approved by your doctor. a. You may drive when you are no longer taking prescription pain medication, you can comfortably wear a seatbelt, and you can safely maneuver your car and apply brakes. b. RETURN TO WORK:  9. You should see your doctor in the office for a follow-up appointment approximately 2-3 weeks after your surgery.  Make sure that you call for this appointment within a day or two after you arrive home to insure a convenient appointment time. 10. OTHER INSTRUCTIONS: DO NOT LIFT, PUSH, OR PULL ANYTHING GREATER THAN 10 POUNDS FOR 6 WEEKS 11. WEAR ABDOMINAL BINDER DURING DAYTIME. Do not have to wear while asleep    WHEN TO CALL YOUR DOCTOR: 1. Fever over 101.0 2. Inability to urinate 3. Nausea and/or vomiting 4. Extreme swelling or bruising 5. Continued bleeding from incision. 6. Increased pain, redness, or drainage from the incision  The clinic staff is available to answer your questions during regular business hours.  Please dont hesitate to call and ask to speak to one of the nurses for clinical concerns.  If you have a medical emergency, go to the nearest emergency room or call 911.  A surgeon from [facility] is always on call at the hospital   [address], [address], [place], [place]  [address] ?  [address] [address], [place], [place]   [address] [phone] ? [phone] ? FAX [phone] Web site: [email or web address]   General Anesthesia, Care After Refer to this sheet in the next few weeks. These instructions provide you with information on caring for yourself after your procedure. Your health care provider may also give you  more specific instructions. Your treatment has been planned according to current medical practices, but problems sometimes occur. Call your health care provider if you have any problems or questions after your procedure. WHAT TO EXPECT AFTER THE PROCEDURE After the procedure, it is typical to experience:  Sleepiness.  Nausea and vomiting. HOME CARE INSTRUCTIONS  For the first 24 hours after general anesthesia:  Have a responsible person with you.  Do not drive a car. If you are alone, do not take public transportation.  Do not drink alcohol.  Do not take medicine that has not been prescribed by your health care provider.  Do not sign important papers or make important decisions.  You may resume a normal diet and activities as directed by your health care provider.  Change bandages (dressings) as directed.  If you have questions or problems that seem related to general anesthesia, call the hospital and ask for the anesthetist or anesthesiologist on call. SEEK MEDICAL CARE IF:  You have nausea and vomiting that continue the day after anesthesia.  You develop a rash. SEEK IMMEDIATE MEDICAL CARE IF:   You have difficulty breathing.  You have chest pain.  You have any allergic problems. Document Released: 03/14/2001 Document Revised: 12/11/2013 Document Reviewed: 06/21/2013 [facility] Patient Information 2015 [place], [place]. This information is not intended to replace advice given to you by your health care provider. Make sure you discuss any questions you have with your health care provider.

## 2015-08-29 ENCOUNTER — Encounter (HOSPITAL_COMMUNITY): Payer: Self-pay | Admitting: General Surgery

## 2015-09-02 ENCOUNTER — Emergency Department (HOSPITAL_BASED_OUTPATIENT_CLINIC_OR_DEPARTMENT_OTHER)
Admission: EM | Admit: 2015-09-02 | Discharge: 2015-09-03 | Disposition: A | Discharge status: Home / Self Care | Payer: Medicaid Other | Attending: Emergency Medicine | Admitting: Emergency Medicine

## 2015-09-02 ENCOUNTER — Encounter (HOSPITAL_BASED_OUTPATIENT_CLINIC_OR_DEPARTMENT_OTHER): Payer: Self-pay | Admitting: Emergency Medicine

## 2015-09-02 DIAGNOSIS — K5909 Other constipation: Secondary | ICD-10-CM | POA: Insufficient documentation

## 2015-09-02 DIAGNOSIS — E785 Hyperlipidemia, unspecified: Secondary | ICD-10-CM | POA: Insufficient documentation

## 2015-09-02 DIAGNOSIS — I10 Essential (primary) hypertension: Secondary | ICD-10-CM | POA: Diagnosis not present

## 2015-09-02 DIAGNOSIS — E119 Type 2 diabetes mellitus without complications: Secondary | ICD-10-CM | POA: Insufficient documentation

## 2015-09-02 DIAGNOSIS — K5903 Drug induced constipation: Secondary | ICD-10-CM

## 2015-09-02 DIAGNOSIS — K5641 Fecal impaction: Secondary | ICD-10-CM | POA: Diagnosis present

## 2015-09-02 DIAGNOSIS — Z8719 Personal history of other diseases of the digestive system: Secondary | ICD-10-CM | POA: Diagnosis not present

## 2015-09-02 DIAGNOSIS — T402X5A Adverse effect of other opioids, initial encounter: Secondary | ICD-10-CM | POA: Insufficient documentation

## 2015-09-02 DIAGNOSIS — M1990 Unspecified osteoarthritis, unspecified site: Secondary | ICD-10-CM | POA: Diagnosis not present

## 2015-09-02 DIAGNOSIS — Z79899 Other long term (current) drug therapy: Secondary | ICD-10-CM | POA: Insufficient documentation

## 2015-09-02 DIAGNOSIS — Z7982 Long term (current) use of aspirin: Secondary | ICD-10-CM | POA: Insufficient documentation

## 2015-09-02 NOTE — ED Notes (Signed)
Hernia surgery last Thursday . Taking Pain meds and has him constipated. Has tried laxatives and stool softener and one fleets enema without relief.

## 2015-09-03 MED ORDER — LIDOCAINE HCL 2 % EX GEL
1.0000 "application " | CUTANEOUS | Status: DC | PRN
  Administered 2015-09-03: 1 via TOPICAL
  Filled 2015-09-03: qty 20

## 2015-09-03 NOTE — Discharge Instructions (Signed)

## 2015-09-03 NOTE — ED Provider Notes (Signed)
CSN: [id number]     Arrival date & time 09/02/15  2328 History   First MD Initiated Contact with Patient 09/03/15 0123     Chief Complaint  Patient presents with  . Fecal Impaction     (Consider location/radiation/quality/duration/timing/severity/associated sxs/prior Treatment) HPI  This is a 64 year old male who had hernia surgery 6 days ago. He is been taking oxycodone which is made constipated. He has not had a bowel movement in over 8 hours despite trying laxity is, a stool softener and a Fleet enema. He has had moderate associated rectal pain. He has had no nausea or vomiting. His pain at the incision site as well controlled.  After arrival in the ED he has had 2 "substantial" bowel movements and feels much better.  Past Medical History  Diagnosis Date  . Arthritis   . Hyperlipidemia   . Diabetes mellitus   . Hypertension   . Umbilical hernia    Past Surgical History  Procedure Laterality Date  . Shoulder surgery      Right  . Umbilical hernia repair N/A 08/28/2015    Procedure: OPEN REPAIR UMBILICAL HERNIA;  Surgeon: Gaynelle Adu, MD;  Location: [facility];  Service: General;  Laterality: N/A;  . Insertion of mesh N/A 08/28/2015    Procedure: INSERTION OF MESH;  Surgeon: Gaynelle Adu, MD;  Location: [facility];  Service: General;  Laterality: N/A;   Family History  Problem Relation Age of Onset  . Hypertension Mother 71  . Coronary artery disease Father 28   Social History  Substance Use Topics  . Smoking status: Never Smoker   . Smokeless tobacco: Current User    Types: Chew  . Alcohol Use: Yes     Comment: OCCASIONAL    Review of Systems  All other systems reviewed and are negative.   Allergies  Lisinopril  Home Medications   Prior to Admission medications   Medication Sig Start Date End Date Taking? Authorizing Provider  aspirin 81 MG tablet Take 81 mg by mouth daily.   Yes Historical Provider, MD  canagliflozin (INVOKANA) 300 MG TABS tablet Take 300 mg by mouth  daily.   Yes Historical Provider, MD  fenofibrate (TRICOR) 145 MG tablet Take 145 mg by mouth daily.   Yes Historical Provider, MD  gabapentin (NEURONTIN) 300 MG capsule Take 300 mg by mouth 2 (two) times daily.   Yes Historical Provider, MD  glipiZIDE (GLUCOTROL XL) 5 MG 24 hr tablet Take 5 mg by mouth daily with breakfast.   Yes Historical Provider, MD  losartan (COZAAR) 50 MG tablet Take 50 mg by mouth daily. 04/15/15  Yes Historical Provider, MD  metFORMIN (GLUCOPHAGE) 1000 MG tablet Take 1,000 mg by mouth 2 (two) times daily with a meal.   Yes Historical Provider, MD  pravastatin (PRAVACHOL) 40 MG tablet Take 80 mg by mouth daily.    Yes Historical Provider, MD  oxyCODONE (OXY IR/ROXICODONE) 5 MG immediate release tablet Take 1-2 tablets (5-10 mg total) by mouth every 4 (four) hours as needed for moderate pain, severe pain or breakthrough pain. 08/28/15   Gaynelle Adu, MD  traZODone (DESYREL) 50 MG tablet Take 50 mg by mouth at bedtime.    Historical Provider, MD   BP 164/70 mmHg  Temp(Src) 97.9 F (36.6 C) (Oral)  Resp 20  Wt 163 lb (73.936 kg)  SpO2 98%   Physical Exam  General: Well-developed, well-nourished male in no acute distress; appearance consistent with age of record HENT: normocephalic; atraumatic Eyes: pupils  equal, round and reactive to light; extraocular muscles intact Neck: supple Heart: regular rate and rhythm Lungs: clear to auscultation bilaterally Abdomen: soft; nondistended; well healing infraumbilical surgical incision with surrounding ecchymosis; no masses or hepatosplenomegaly; bowel sounds present Rectal: Normal sphincter tone; no stool in vault; tenderness on exam Extremities: No deformity; full range of motion; pulses normal Neurologic: Awake, alert; motor function intact in all extremities and symmetric; no facial droop Skin: Warm and dry Psychiatric: Normal mood and affect    ED Course  Procedures (including critical care time)   MDM  Patient  advised to continue laxity is as long as he is taking the oxycodone and to minimize his use of oxycodone.   Paula Libra, MD 09/03/15 [phone]

## 2019-04-30 MED FILL — predniSONE 10 MG TABS: 10 | 6 days supply | Qty: 21 | Fill #0

## 2019-04-30 MED FILL — MELOXICAM 7.5 MG TABLET: 7.5 | 30 days supply | Qty: 30 | Fill #0

## 2019-10-11 ENCOUNTER — Ambulatory Visit: Payer: Self-pay | Admitting: Family Medicine

## 2019-10-12 ENCOUNTER — Ambulatory Visit (INDEPENDENT_AMBULATORY_CARE_PROVIDER_SITE_OTHER): Payer: Medicare Other | Admitting: Family Medicine

## 2019-10-12 ENCOUNTER — Other Ambulatory Visit: Payer: Self-pay

## 2019-10-12 ENCOUNTER — Encounter: Payer: Self-pay | Admitting: Family Medicine

## 2019-10-12 VITALS — BP 122/60 | HR 90 | Ht 66.0 in | Wt 169.0 lb

## 2019-10-12 DIAGNOSIS — G8929 Other chronic pain: Secondary | ICD-10-CM

## 2019-10-12 DIAGNOSIS — M5416 Radiculopathy, lumbar region: Secondary | ICD-10-CM

## 2019-10-12 DIAGNOSIS — R7309 Other abnormal glucose: Secondary | ICD-10-CM

## 2019-10-12 DIAGNOSIS — G6281 Critical illness polyneuropathy: Secondary | ICD-10-CM

## 2019-10-12 DIAGNOSIS — I10 Essential (primary) hypertension: Secondary | ICD-10-CM

## 2019-10-12 DIAGNOSIS — E118 Type 2 diabetes mellitus with unspecified complications: Secondary | ICD-10-CM

## 2019-10-12 DIAGNOSIS — H5410 Blindness, one eye, low vision other eye, unspecified eyes: Secondary | ICD-10-CM

## 2019-10-12 DIAGNOSIS — E785 Hyperlipidemia, unspecified: Secondary | ICD-10-CM

## 2019-10-12 DIAGNOSIS — M25512 Pain in left shoulder: Secondary | ICD-10-CM

## 2019-10-12 LAB — POCT GLYCOSYLATED HEMOGLOBIN (HGB A1C): HbA1c, POC (controlled diabetic range): 7.6 % — AB (ref 0.0–7.0)

## 2019-10-12 MED ORDER — GLIPIZIDE ER 10 MG PO TB24: 10.0000 mg | ORAL_TABLET | Freq: Every day | ORAL | 3 refills | Status: DC

## 2019-10-12 MED ORDER — FENOFIBRATE 145 MG PO TABS: 145.0000 mg | ORAL_TABLET | Freq: Every day | ORAL | 1 refills | Status: DC

## 2019-10-12 MED ORDER — DICLOFENAC SODIUM 1 % TD GEL: 4.0000 g | Freq: Four times a day (QID) | TRANSDERMAL | 1 refills | Status: DC

## 2019-10-12 MED ORDER — LOSARTAN POTASSIUM 50 MG PO TABS
50.0000 mg | ORAL_TABLET | Freq: Every day | ORAL | 1 refills | Status: DC
Start: 2019-10-12 — End: 2020-04-09

## 2019-10-12 MED ORDER — MELOXICAM 7.5 MG PO TABS: 7.5000 mg | ORAL_TABLET | Freq: Every day | ORAL | 0 refills | Status: DC

## 2019-10-12 MED ORDER — PRAVASTATIN SODIUM 40 MG PO TABS: 80.0000 mg | ORAL_TABLET | Freq: Every day | ORAL | 1 refills | Status: DC

## 2019-10-12 MED ORDER — METFORMIN HCL 1000 MG PO TABS: 1000.0000 mg | ORAL_TABLET | Freq: Two times a day (BID) | ORAL | 1 refills | Status: DC

## 2019-10-12 MED ORDER — GABAPENTIN 300 MG PO CAPS: 300.0000 mg | ORAL_CAPSULE | Freq: Two times a day (BID) | ORAL | 1 refills | Status: DC

## 2019-10-12 MED ORDER — EMPAGLIFLOZIN 25 MG PO TABS: 25.0000 mg | ORAL_TABLET | Freq: Every day | ORAL | 3 refills | Status: DC

## 2019-10-12 NOTE — Patient Instructions (Signed)
It was a pleasure to see you today! Thank you for choosing [facility] for your primary care. Mark Chase was seen for to establish care.   1.  We refilled her medications as requested.  2.  We have referred you to orthopedics for ongoing pain management.  3.  Please follow-up with me in a few weeks to discuss ongoing pain needs.  Best,  Mark Lowenstein, MD, MS FAMILY MEDICINE RESIDENT - PGY3 10/12/2019 3:05 PM

## 2019-10-12 NOTE — Progress Notes (Signed)
Subjective:  Mark Chase is a 68 y.o. male who presents to the [facility] today with a chief complaint of new patient visit.Judd Lien   HPI:  Due to language barrier, an interpreter was present during the history-taking and subsequent discussion (and for part of the physical exam) with this patient.  Patient here to establish care.  Patient is baseline blind.  Cannot drive.  [employer] transportation is not taking improved prior PCP any longer.  Glaucoma He has baseline blindness in both eyes due to glaucoma.  [facility] Dr. Steele Bergreadwell.  Last seen 05/2019.  He plans to continue to follow with them.  Chronic lower back pain, left shoulder pain, left knee pain Patient reports chronic back pain, left shoulder pain, left knee pain.  Patient had recent x-rays of cervical, thoracic, lumbar spine, as well as MR lumbar spine in 12/2018.  MR showed degenerative disc disease in L1-L2 and L2-L3.  With some foraminal narrowing in L3-L4 and L5-S1.  Has some chronic cervical disc disease as well.  Patient states that the pain is worse, but hard to determine the timeline of how it is worse from prior issues.  Patient states that he may take meloxicam daily for pain.  A recent CMP 08/2019 that showed a creatinine of 1.3.  This is stable prior studies.  Patient also states that he is followed up with orthopedics in the past for spinal injections.  He is requesting an orthopedic referral for to establish with orthopedist that can inject the spine.  Patient has had left shoulder arthroscopy in 2006.  Patient states that he also has pain in his fingers.  Interested in having additional control for pain management.  He has tried physical therapy in the past several years ago, says it did not help him very much.  Diabetes type 2 Patient has diabetes type II.  He had an A1c today of 7.2.  He takes glipizide XR 10 mg daily, Metformin 1000 mg twice daily, Jardiance 25 mg.  He follows [facility], Dr. Allena KatzPatel.  Patient also has peripheral neuropathy which he takes gabapentin for.  Hyperlipidemia On 08/2019, last lipid panel LDL of 118.  Patient is on pravastatin 80 mg and fenofibrate.  Essential hypertension Patient with BP well-controlled today.  Take losartan 100 mg daily.  Medication refills Patient said he needs refills of multiple medications that are ordered below.    ROS: As HPI, otherwise all systems reviewed and are negative  PMH:  The following were reviewed and entered/updated in epic: Past Medical History:  Diagnosis Date  . Arthritis   . Diabetes mellitus   . Hyperlipidemia   . Hypertension   . Umbilical hernia    Patient Active Problem List   Diagnosis Date Noted  . Dyspnea 01/18/2012  . Abnormal EKG 01/18/2012  . Tobacco dipper 01/18/2012  . Cardiovascular risk factor 01/18/2012   Past Surgical History:  Procedure Laterality Date  . INSERTION OF MESH N/A 08/28/2015   Procedure: INSERTION OF MESH;  Surgeon: Gaynelle AduEric Wilson, MD;  Location: [facility];  Service: General;  Laterality: N/A;  . SHOULDER SURGERY     Right  . UMBILICAL HERNIA REPAIR N/A 08/28/2015   Procedure: OPEN REPAIR UMBILICAL HERNIA;  Surgeon: Gaynelle AduEric Wilson, MD;  Location: [facility];  Service: General;  Laterality: N/A;    Family History  Problem Relation Age of Onset  . Hypertension Mother 6370  . Coronary artery disease Father 5350  Medications- reviewed and updated Current Outpatient Medications  Medication Sig Dispense Refill  . aspirin 81 MG tablet Take 81 mg by mouth daily.    . canagliflozin (INVOKANA) 300 MG TABS tablet Take 300 mg by mouth daily.    . diclofenac sodium (VOLTAREN) 1 % GEL Apply 4 g topically 4 (four) times daily. 50 g 1  . empagliflozin (JARDIANCE) 25 MG TABS tablet Take 25 mg by mouth daily. 90 tablet 3  . fenofibrate (TRICOR) 145 MG tablet Take 1 tablet (145 mg total) by mouth daily. 30 tablet 1  . gabapentin (NEURONTIN) 300 MG capsule Take 1 capsule  (300 mg total) by mouth 2 (two) times daily. 60 capsule 1  . glipiZIDE (GLUCOTROL XL) 10 MG 24 hr tablet Take 1 tablet (10 mg total) by mouth daily. 90 tablet 3  . losartan (COZAAR) 50 MG tablet Take 1 tablet (50 mg total) by mouth daily. 30 tablet 1  . meloxicam (MOBIC) 7.5 MG tablet Take 1 tablet (7.5 mg total) by mouth daily. 30 tablet 0  . metFORMIN (GLUCOPHAGE) 1000 MG tablet Take 1 tablet (1,000 mg total) by mouth 2 (two) times daily with a meal. 60 tablet 1  . oxyCODONE (OXY IR/ROXICODONE) 5 MG immediate release tablet Take 1-2 tablets (5-10 mg total) by mouth every 4 (four) hours as needed for moderate pain, severe pain or breakthrough pain. 40 tablet 0  . pravastatin (PRAVACHOL) 40 MG tablet Take 2 tablets (80 mg total) by mouth daily. 30 tablet 1  . traZODone (DESYREL) 50 MG tablet Take 50 mg by mouth at bedtime.     No current facility-administered medications for this visit.     Allergies-reviewed and updated Allergies  Allergen Reactions  . Lisinopril Cough    Social History   Socioeconomic History  . Marital status: Married    Spouse name: Not on file  . Number of children: 2  . Years of education: Not on file  . Highest education level: Not on file  Occupational History  . Occupation: Works for [employer]  Social Needs  . Financial resource strain: Not on file  . Food insecurity    Worry: Not on file    Inability: Not on file  . Transportation needs    Medical: Not on file    Non-medical: Not on file  Tobacco Use  . Smoking status: Never Smoker  . Smokeless tobacco: Current User    Types: Chew  Substance and Sexual Activity  . Alcohol use: Yes    Comment: OCCASIONAL  . Drug use: Not on file  . Sexual activity: Not on file  Lifestyle  . Physical activity    Days per week: Not on file    Minutes per session: Not on file  . Stress: Not on file  Relationships  . Social Musician on phone: Not on file    Gets together: Not on file     Attends religious service: Not on file    Active member of club or organization: Not on file    Attends meetings of clubs or organizations: Not on file    Relationship status: Not on file  Other Topics Concern  . Not on file  Social History Narrative  . Not on file      Objective:  Physical Exam: BP 122/60   Pulse 90   Ht 5\' 6"  (1.676 m)   Wt 169 lb (76.7 kg)   SpO2 98%   BMI  27.28 kg/m   Gen: NAD, resting comfortably CV: RRR with no murmurs appreciated Pulm: NWOB, CTAB with no crackles, wheezes, or rhonchi GI: Normal bowel sounds present. Soft, Nontender, Nondistended. MSK: no edema, cyanosis, or clubbing noted Skin: warm, dry Neuro: grossly normal, moves all extremities Psych: Normal affect and thought content  Results for orders placed or performed in visit on 10/12/19 (from the past 72 hour(s))  HgB A1c     Status: Abnormal   Collection Time: 10/12/19  2:29 PM  Result Value Ref Range   Hemoglobin A1C     HbA1c POC (<> result, manual entry)     HbA1c, POC (prediabetic range)     HbA1c, POC (controlled diabetic range) 7.6 (A) 0.0 - 7.0 %     Assessment/Plan:  No problem-specific Assessment & Plan notes found for this encounter.    Lab Orders     HgB A1c  Meds ordered this encounter  Medications  . diclofenac sodium (VOLTAREN) 1 % GEL    Sig: Apply 4 g topically 4 (four) times daily.    Dispense:  50 g    Refill:  1  . meloxicam (MOBIC) 7.5 MG tablet    Sig: Take 1 tablet (7.5 mg total) by mouth daily.    Dispense:  30 tablet    Refill:  0  . metFORMIN (GLUCOPHAGE) 1000 MG tablet    Sig: Take 1 tablet (1,000 mg total) by mouth 2 (two) times daily with a meal.    Dispense:  60 tablet    Refill:  1  . pravastatin (PRAVACHOL) 40 MG tablet    Sig: Take 2 tablets (80 mg total) by mouth daily.    Dispense:  30 tablet    Refill:  1  . losartan (COZAAR) 50 MG tablet    Sig: Take 1 tablet (50 mg total) by mouth daily.    Dispense:  30 tablet    Refill:   1  . glipiZIDE (GLUCOTROL XL) 10 MG 24 hr tablet    Sig: Take 1 tablet (10 mg total) by mouth daily.    Dispense:  90 tablet    Refill:  3  . empagliflozin (JARDIANCE) 25 MG TABS tablet    Sig: Take 25 mg by mouth daily.    Dispense:  90 tablet    Refill:  3  . gabapentin (NEURONTIN) 300 MG capsule    Sig: Take 1 capsule (300 mg total) by mouth 2 (two) times daily.    Dispense:  60 capsule    Refill:  1  . fenofibrate (TRICOR) 145 MG tablet    Sig: Take 1 tablet (145 mg total) by mouth daily.    Dispense:  30 tablet    Refill:  [place], MD, MS FAMILY MEDICINE RESIDENT - PGY3 10/13/2019 5:52 PM

## 2019-10-13 DIAGNOSIS — G6281 Critical illness polyneuropathy: Secondary | ICD-10-CM | POA: Insufficient documentation

## 2019-10-13 DIAGNOSIS — E785 Hyperlipidemia, unspecified: Secondary | ICD-10-CM | POA: Insufficient documentation

## 2019-10-13 DIAGNOSIS — H5410 Blindness, one eye, low vision other eye, unspecified eyes: Secondary | ICD-10-CM | POA: Insufficient documentation

## 2019-10-13 DIAGNOSIS — M5416 Radiculopathy, lumbar region: Secondary | ICD-10-CM | POA: Insufficient documentation

## 2019-10-13 DIAGNOSIS — R7309 Other abnormal glucose: Secondary | ICD-10-CM | POA: Insufficient documentation

## 2019-10-13 DIAGNOSIS — M25512 Pain in left shoulder: Secondary | ICD-10-CM | POA: Insufficient documentation

## 2019-10-13 DIAGNOSIS — E118 Type 2 diabetes mellitus with unspecified complications: Secondary | ICD-10-CM | POA: Insufficient documentation

## 2019-10-13 DIAGNOSIS — I10 Essential (primary) hypertension: Secondary | ICD-10-CM | POA: Insufficient documentation

## 2019-10-13 DIAGNOSIS — G8929 Other chronic pain: Secondary | ICD-10-CM | POA: Insufficient documentation

## 2019-10-13 NOTE — Assessment & Plan Note (Signed)
We referred patient to orthopedics, they may be able to help him with his shoulder as well.  Continue meloxicam.  We have added diclofenac gel.  We have written a note for patient have light duty work due to his pain.  We will reassess this going forward.

## 2019-10-13 NOTE — Assessment & Plan Note (Signed)
Chronic back pain due to lumbar radiculopathy.  Patient takes meloxicam.  We will continue this for now.  Referred patient as requested to reestablish with orthopedics for ongoing lower back injections.  Patient follow-up in 1 month for ongoing back pain.

## 2019-10-13 NOTE — Assessment & Plan Note (Signed)
Patient takes gabapentin for this.  We will refill as requested.

## 2019-10-13 NOTE — Assessment & Plan Note (Signed)
A1c close to goal at 7.2.  Follows with [facility] nephrology.  Continue current regimen.  Patient is optimized on statin, blood pressure regimen, ACE inhibitor for renal protection.

## 2019-10-13 NOTE — Assessment & Plan Note (Signed)
Blood pressure well controlled on current regimen.  Continue to monitor.

## 2019-10-13 NOTE — Assessment & Plan Note (Signed)
Patient follows with [facility] endocrinology.

## 2019-10-13 NOTE — Assessment & Plan Note (Signed)
Continue patient's current statin and fenofibrate.

## 2019-10-23 ENCOUNTER — Other Ambulatory Visit: Payer: Self-pay

## 2019-10-23 ENCOUNTER — Encounter: Payer: Self-pay | Admitting: Family Medicine

## 2019-10-23 ENCOUNTER — Ambulatory Visit (INDEPENDENT_AMBULATORY_CARE_PROVIDER_SITE_OTHER): Payer: Medicare Other | Admitting: Family Medicine

## 2019-10-23 DIAGNOSIS — M25512 Pain in left shoulder: Secondary | ICD-10-CM | POA: Diagnosis not present

## 2019-10-23 DIAGNOSIS — G8929 Other chronic pain: Secondary | ICD-10-CM

## 2019-10-23 NOTE — Progress Notes (Addendum)
Office Visit Note   Patient: Mark Chase           Date of Birth: 1951-11-29           MRN: [id number] Visit Date: 10/23/2019 Requested by: Leeanne Rio, [place],  [place] [address] PCP: Bonnita Hollow, MD  Subjective: No chief complaint on file.   HPI: He was here for multiple issues today but unfortunately we did not have an interpreter physically present at the time of his appointment.  By the time we were able to coordinate with a phone interpreter, half of his visit was gone so we only addressed his chronic left shoulder pain today.  Apparently his shoulder has been hurting for about 5 years with no definite injury.  He states that he has had an injection elsewhere in the past which only helped for about a month.  It hurts with any overhead or behind the back movement, and it hurts mostly on the posterior lateral aspect.  Meloxicam helps but he is concerned about taking it long-term due to potential injury to the kidneys.  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               ROS: He has chronic back pain, chronic knee and hand pain.  He is legally blind.  He has diabetes which has been under adequate control.  All other systems were reviewed and are negative.  Objective: Vital Signs: There were no vitals taken for this visit.  Physical Exam:  General:  Alert and oriented, in no acute distress. Pulm:  Breathing unlabored. Psy:  Normal mood, congruent affect. Skin:  No rash  Left shoulder: Slight decrease in range of motion with behind the back reach.  There is audible crepitus with active range of motion.  He has slight tenderness at the [facility] joint, moderate tenderness in the posterior subacromial space.  Rotator cuff strength is intact in all directions, but he does have some pain with empty can test and with external rotation.  Speeds test is negative.  He has no Popeye  deformity.   Imaging: None today.  Assessment & Plan: 1.  Chronic left shoulder pain, cannot rule out partial supraspinatus tear. -Discussed options with him, he has not improved with previous injection and is only getting partial relief with meloxicam.  He has apparently been to physical therapy in the past with minimal improvement.  We will proceed with MRI scan.  If indication for surgery, we will have him consult with one of our surgeons.  Otherwise we could try another injection.     Procedures: No procedures performed  No notes on file     PMFS History: Patient Active Problem List   Diagnosis Date Noted  . Controlled type 2 diabetes mellitus with complication, without long-term current use of insulin ([place]) 10/13/2019  . Lumbar radiculopathy, chronic 10/13/2019  . Hyperlipidemia 10/13/2019  . Polyneuropathy associated with critical illness ([place]) 10/13/2019  . Essential hypertension 10/13/2019  . Blindness and low vision 10/13/2019  . Chronic left shoulder pain 10/13/2019  . Elevated hemoglobin A1c 10/13/2019  . Dyspnea 01/18/2012  . Abnormal EKG 01/18/2012  . Tobacco dipper 01/18/2012  . Cardiovascular risk factor 01/18/2012   Past Medical History:  Diagnosis Date  . Arthritis   . Diabetes mellitus   . Hyperlipidemia   . Hypertension   . Umbilical hernia     Family History  Problem Relation Age of Onset  . Hypertension Mother 73  . Coronary artery disease Father 5    Past Surgical History:  Procedure Laterality Date  . INSERTION OF MESH N/A 08/28/2015   Procedure: INSERTION OF MESH;  Surgeon: Gaynelle Adu, MD;  Location: [facility];  Service: General;  Laterality: N/A;  . SHOULDER SURGERY     Right  . UMBILICAL HERNIA REPAIR N/A 08/28/2015   Procedure: OPEN REPAIR UMBILICAL HERNIA;  Surgeon: Gaynelle Adu, MD;  Location: [facility];  Service: General;  Laterality: N/A;   Social History   Occupational History  . Occupation: Works for [employer] the [employer]  Tobacco  Use  . Smoking status: Never Smoker  . Smokeless tobacco: Current User    Types: Chew  Substance and Sexual Activity  . Alcohol use: Yes    Comment: OCCASIONAL  . Drug use: Not on file  . Sexual activity: Not on file

## 2019-10-23 NOTE — Addendum Note (Signed)
Addended by: Hortencia Pilar on: 10/23/2019 09:07 AM   Modules accepted: Level of Service

## 2019-10-25 ENCOUNTER — Other Ambulatory Visit: Payer: Self-pay | Admitting: Family Medicine

## 2019-10-25 DIAGNOSIS — G8929 Other chronic pain: Secondary | ICD-10-CM

## 2019-10-25 DIAGNOSIS — M25512 Pain in left shoulder: Secondary | ICD-10-CM

## 2019-10-31 ENCOUNTER — Other Ambulatory Visit: Payer: Self-pay

## 2019-10-31 ENCOUNTER — Ambulatory Visit (INDEPENDENT_AMBULATORY_CARE_PROVIDER_SITE_OTHER): Payer: Medicare Other | Admitting: Family Medicine

## 2019-10-31 ENCOUNTER — Encounter: Payer: Self-pay | Admitting: Family Medicine

## 2019-10-31 VITALS — BP 132/58 | HR 99 | Wt 169.2 lb

## 2019-10-31 DIAGNOSIS — M5416 Radiculopathy, lumbar region: Secondary | ICD-10-CM | POA: Diagnosis not present

## 2019-10-31 MED ORDER — DULOXETINE HCL 20 MG PO CPEP: 20.0000 mg | ORAL_CAPSULE | Freq: Every day | ORAL | 3 refills | Status: DC

## 2019-10-31 NOTE — Assessment & Plan Note (Signed)
Patient with chronic lower back pain.  Currently on diclofenac gel, meloxicam, gabapentin.  Patient wanting referral to neurosurgery to discuss steroid injections, will refer as requested.  We will also start patient on Cymbalta 20 mg, can increase as we go forward.  Patient follow-up in 1 month to reassess pain control.

## 2019-10-31 NOTE — Patient Instructions (Signed)
It was a pleasure to see you today! Thank you for choosing [facility] for your primary care. Keldric Poyer was seen for chronic lower back pain.   For your back pain, we are starting a new medication called Cymbalta.  Take this daily.  It takes 2 weeks to 1 month to get to full effect.  We can always increase the dose if you are not having any improvement, please call the office we can do so.  We have referred you to neurosurgery, the office will call you to coordinate a office visit.  Best,  Marny Lowenstein, MD, MS FAMILY MEDICINE RESIDENT - PGY3 10/31/2019 2:13 PM

## 2019-10-31 NOTE — Progress Notes (Signed)
    Subjective:  Mark Chase is a 68 y.o. male who presents to the [facility] today with a chief complaint of lower back pain.   HPI:  Due to language barrier, an interpreter was present during the history-taking and subsequent discussion (and for part of the physical exam) with this patient.  Chronic lower back pain, unchanged Patient is here for follow-up for chronic lower back pain. He reports that the pain can be so bad that he might not get out of bed some days but overall is stable. He is currently on meloxicam 7.5 daily.  He has also been using diclofenac gel.  Reports diclofenac gel helps with his shoulder and is knees temporarily but really apply it.  Patient was seen by orthopedics, Dr. [name], at [place] earlier this month for left shoulder issues.  They said that they would address his lower back pain after they deal with his shoulder.  Patient is frustrated with the progress and would like referral to neurosurgery. Patient denies any new weakness, numbness, or tingling.  Denies any bowel or bladder dysfunction.  As mentioned previous note patient had MRI lumbar spine 12/2018 that showed some degenerative disease disease in L1-L2 and L2-L3, foraminal narrowing L3-L4 and L5-S1.  He has done physical therapy in the past without improvement.  He has had corticosteroid injections in the past for pain.  His last one was approximately 8 months ago.  Patient is asking about laser surgery.  I told him that I cannot speak on these as I do not do them as part of my practice.  I have provided reassurance that I can help manage his pain medications.  I offer going up on his gabapentin versus starting Cymbalta.  Patient is hesitant to go up on his gabapentin as is concerned that it will affect his kidneys.  He is concerned about possible side effects.  Counseled patient on the time it takes for Cymbalta to reach efficacy.  Also said that we would start on lower dose and titrate up as needed.  ROS: Per HPI  Objective:  Physical Exam: BP (!) 132/58   Pulse 99   Wt 169 lb 3.2 oz (76.7 kg)   SpO2 98%   BMI 27.31 kg/m   Gen: NAD, resting comfortably Pulm: NWOB Lower back Inspection: No gross deformities Palpation: Nontender palpation ROM: Limited flexion extension due to pain Strength: Normal strength in lower extremities bilaterally Stability: Patient able to walk stably Special tests: None Skin: warm, dry Neuro: grossly normal, moves all extremities Psych: Normal affect and thought content  No results found for this or any previous visit (from the past 72 hour(s)).   Assessment/Plan:  Lumbar radiculopathy, chronic Patient with chronic lower back pain.  Currently on diclofenac gel, meloxicam, gabapentin.  Patient wanting referral to neurosurgery to discuss steroid injections, will refer as requested.  We will also start patient on Cymbalta 20 mg, can increase as we go forward.  Patient follow-up in 1 month to reassess pain control.  Precepted with Dr. Ardelia Mems.  Lab Orders  No laboratory test(s) ordered today    Meds ordered this encounter  Medications  . DULoxetine (CYMBALTA) 20 MG capsule    Sig: Take 1 capsule (20 mg total) by mouth daily.    Dispense:  30 capsule    Refill:  [place], MD, MS FAMILY MEDICINE RESIDENT - PGY3 10/31/2019 2:39 PM

## 2019-11-17 ENCOUNTER — Other Ambulatory Visit: Payer: Self-pay | Admitting: Family Medicine

## 2019-11-17 DIAGNOSIS — G8929 Other chronic pain: Secondary | ICD-10-CM

## 2019-11-17 DIAGNOSIS — M25512 Pain in left shoulder: Secondary | ICD-10-CM

## 2019-11-30 ENCOUNTER — Other Ambulatory Visit: Payer: Self-pay

## 2019-11-30 ENCOUNTER — Ambulatory Visit (INDEPENDENT_AMBULATORY_CARE_PROVIDER_SITE_OTHER): Payer: Medicare Other | Admitting: Family Medicine

## 2019-11-30 VITALS — BP 134/64 | HR 88 | Ht 66.0 in | Wt 162.2 lb

## 2019-11-30 DIAGNOSIS — Z23 Encounter for immunization: Secondary | ICD-10-CM | POA: Diagnosis not present

## 2019-11-30 DIAGNOSIS — H5410 Blindness, one eye, low vision other eye, unspecified eyes: Secondary | ICD-10-CM | POA: Diagnosis not present

## 2019-11-30 DIAGNOSIS — M5416 Radiculopathy, lumbar region: Secondary | ICD-10-CM

## 2019-11-30 NOTE — Patient Instructions (Addendum)
It was a pleasure to see you today! Thank you for choosing [facility] for your primary care. Mark Chase was seen for lower back pain.   Please continue your mediations as prescribed. We are ordering home health PT.    Best,  Marny Lowenstein, MD, MS FAMILY MEDICINE RESIDENT - PGY3 11/30/2019 8:58 AM

## 2019-11-30 NOTE — Assessment & Plan Note (Signed)
Patient with chronic lower back pain.  It is improved.  Reports some joint stiffness that does improve with movement.  At this is consistent with osteoarthritis.  Patient is on multiple pain modalities including diclofenac gel, Mobic, duloxetine.  He is exploring surgical options.  Recommend patient to pursue physical therapy as well.  Given patient is blind and has difficulty navigating, will send home health evaluation referral.  Patient follow-up in 4 weeks for back pain.

## 2019-11-30 NOTE — Progress Notes (Signed)
    Subjective:  Mark Chase is a 68 y.o. male who presents to the [facility] today with a chief complaint of back pain follow up.   Due to language barrier, an interpreter was present during the history-taking and subsequent discussion (and for part of the physical exam) with this patient.   HPI:  Patient presents for chronic lower back pain follow-up.  Patient reports that he has had significant improvement in pain since starting Cymbalta.  Patient is also followed up with neurosurgery since we last spoke.  Neurosurgery told patient that surgery may help, but is no guarantee.  Patient reports that he still has stiffness and his left leg.  This is worsened when sitting for long period time.  It improves when moving around.  Reports similar pain in his left shoulder that improves with movement.  Patient denies any new weakness or change in sensation in his lower extremities.  Denies any bowel or bladder incontinence.   ROS: Per HPI   Objective:  Physical Exam: BP 134/64   Pulse 88   Ht 5\' 6"  (1.676 m)   Wt 162 lb 4 oz (73.6 kg)   SpO2 98%   BMI 26.19 kg/m   Gen: NAD, resting comfortably Pulm: NWOB MSK: 5 of 5 strength in lower extremities, throughout, able to walk without assistance, no edema,  Skin: warm, dry Neuro: grossly normal, moves all extremities Psych: Normal affect and thought content  No results found for this or any previous visit (from the past 72 hour(s)).   Assessment/Plan:  Lumbar radiculopathy, chronic Patient with chronic lower back pain.  It is improved.  Reports some joint stiffness that does improve with movement.  At this is consistent with osteoarthritis.  Patient is on multiple pain modalities including diclofenac gel, Mobic, duloxetine.  He is exploring surgical options.  Recommend patient to pursue physical therapy as well.  Given patient is blind and has difficulty navigating, will send home health evaluation referral.  Patient follow-up in 4 weeks for back  pain.   Lab Orders  No laboratory test(s) ordered today    No orders of the defined types were placed in this encounter.     Marny Lowenstein, MD, MS FAMILY MEDICINE RESIDENT - PGY3 11/30/2019 9:11 AM

## 2019-12-04 ENCOUNTER — Other Ambulatory Visit: Payer: Self-pay | Admitting: Family Medicine

## 2019-12-04 DIAGNOSIS — G8929 Other chronic pain: Secondary | ICD-10-CM

## 2019-12-04 DIAGNOSIS — M25512 Pain in left shoulder: Secondary | ICD-10-CM

## 2019-12-04 DIAGNOSIS — M5416 Radiculopathy, lumbar region: Secondary | ICD-10-CM

## 2019-12-30 ENCOUNTER — Other Ambulatory Visit: Payer: Self-pay | Admitting: Family Medicine

## 2019-12-30 DIAGNOSIS — G8929 Other chronic pain: Secondary | ICD-10-CM

## 2019-12-30 DIAGNOSIS — M25512 Pain in left shoulder: Secondary | ICD-10-CM

## 2019-12-31 ENCOUNTER — Encounter: Payer: Self-pay | Admitting: Family Medicine

## 2019-12-31 ENCOUNTER — Ambulatory Visit (INDEPENDENT_AMBULATORY_CARE_PROVIDER_SITE_OTHER): Payer: Medicare Other | Admitting: Family Medicine

## 2019-12-31 ENCOUNTER — Other Ambulatory Visit: Payer: Self-pay

## 2019-12-31 VITALS — BP 130/82 | HR 106 | Wt 160.6 lb

## 2019-12-31 DIAGNOSIS — M25512 Pain in left shoulder: Secondary | ICD-10-CM

## 2019-12-31 DIAGNOSIS — Z789 Other specified health status: Secondary | ICD-10-CM

## 2019-12-31 DIAGNOSIS — M5416 Radiculopathy, lumbar region: Secondary | ICD-10-CM

## 2019-12-31 DIAGNOSIS — H5410 Blindness, one eye, low vision other eye, unspecified eyes: Secondary | ICD-10-CM

## 2019-12-31 DIAGNOSIS — G8929 Other chronic pain: Secondary | ICD-10-CM

## 2019-12-31 MED ORDER — MELOXICAM 7.5 MG PO TABS: 7.5000 mg | ORAL_TABLET | Freq: Every day | ORAL | 0 refills | Status: DC | PRN

## 2019-12-31 MED ORDER — DULOXETINE HCL 40 MG PO CPEP: 40.0000 mg | ORAL_CAPSULE | Freq: Every day | ORAL | 3 refills | Status: DC

## 2019-12-31 NOTE — Patient Instructions (Signed)
It was a pleasure to see you today! Thank you for choosing [facility] for your primary care. Mark Chase was seen for shoulder and back pain.   For your back pain, we are increasing your Cymbalta to 40 mg.    I will follow up to to see what happens to the home health physical therapy referral.   For your shoulder, I recommend you follow-up with your orthopedic doctor.  Here is the office information:  [facility] [address] [place],  [place]  [address] Get Driving Directions Main: [phone]  Fax: [phone]  Best,  Thomes Dinning, MD, MS FAMILY MEDICINE RESIDENT - PGY3 12/31/2019 3:41 PM

## 2020-01-02 ENCOUNTER — Encounter: Payer: Self-pay | Admitting: Family Medicine

## 2020-01-02 MED ORDER — DICLOFENAC SODIUM 1 % EX GEL: CUTANEOUS | 0 refills | Status: DC

## 2020-01-02 NOTE — Progress Notes (Signed)
    Subjective:  Mark Chase is a 69 y.o. male who presents to the [facility] today with a chief complaint of left shoulder pain, back pain follow-up.   HPI:  Due to language barrier, an interpreter was present during the history-taking and subsequent discussion (and for part of the physical exam) with this patient.   Patient here for follow-up for chronic left shoulder pain and lower back pain.  Patient has had improvement in pain since starting Cymbalta.  Also takes meloxicam as needed, topical diclofenac gel, Tylenol.  Patient's been lost to follow-up to orthopedics since last November for his left shoulder.  Orthopedics did give patient 1 steroid injection.  They were recommending MRI for further assessment.  For patient's back pain, he has follow-up with neurosurgery, where they discussed surgical versus nonoperative options.  Patient now is opting for nonsurgical means.  Patient was referred to home on physical therapy at last visit.  Patient says that PT did not contact him.  He still interested in physical therapy.  Patient states that he is chronic pain tracking worse, but or not controlled to the level that he would like.    ROS: Per HPI  PMH: Smoking history reviewed.    Objective:  Physical Exam: BP 130/82   Pulse (!) 106   Wt 160 lb 9.6 oz (72.8 kg)   SpO2 99%   BMI 25.92 kg/m   Gen: NAD, resting comfortably Pulm: NWOB,  MSK: Left shoulder with normal range of motion, albeit patient is slow to achieve full range of motion given pain, no gross swelling or deformities, patient's lower back no overlying bruises or deformities, patient is able to ambulate with a cane, no tenderness palpation in the shoulder or in the lower back Skin: warm, dry Neuro: grossly normal, moves all extremities Psych: Normal affect and thought content  No results found for this or any previous visit (from the past 72 hour(s)).   Assessment/Plan:  No problem-specific Assessment & Plan notes found for  this encounter.   Lab Orders  No laboratory test(s) ordered today    Meds ordered this encounter  Medications  . meloxicam (MOBIC) 7.5 MG tablet    Sig: Take 1 tablet (7.5 mg total) by mouth daily as needed for pain.    Dispense:  30 tablet    Refill:  0  . DULoxetine 40 MG CPEP    Sig: Take 40 mg by mouth daily.    Dispense:  30 capsule    Refill:  3      Thomes Dinning, MD, MS FAMILY MEDICINE RESIDENT - PGY2 01/02/2020 5:19 PM

## 2020-01-02 NOTE — Assessment & Plan Note (Signed)
Chronic lumbar radiculopathy, improved and starting Cymbalta, patient was interested in increasing dose.  We will do this as he had improvement.  Continue also low-dose meloxicam, diclofenac, Tylenol.  Given patient did not connect with PT, concerned that this is due to patient's language barrier versus his physical limitations because of his blindness.  Will place referral to chronic care management to help assess patient's needs and connected him with resources.

## 2020-01-02 NOTE — Assessment & Plan Note (Signed)
Given that patient has been following with orthopedics for her shoulder, recommend reconnecting.  Continue current pain modalities.  Physical therapy to assess once referral has been established.

## 2020-01-04 ENCOUNTER — Telehealth: Payer: Self-pay | Admitting: *Deleted

## 2020-01-04 ENCOUNTER — Telehealth: Payer: Self-pay | Admitting: Family Medicine

## 2020-01-04 NOTE — Chronic Care Management (AMB) (Signed)
  Chronic Care Management   Outreach Note  01/04/2020 Name: Mark Chase MRN: [id number] DOB: 1951-01-16  Mark Chase is a 69 y.o. year old male who is a primary care patient of [name], MD. I reached out to Mark Chase by phone today in response to a referral sent by [name]'s PCP, Dr. Fanny Bien     An unsuccessful telephone outreach was attempted today. The patient was referred to the case management team by for assistance with care management and care coordination.   Follow Up Plan: A HIPPA compliant phone message was left for the patient providing contact information and requesting a return call.  The care management team will reach out to the patient again over the next 7 days.  If patient returns call to provider office, please advise to call Embedded Care Management Care Guide Elisha Ponder LPN at [phone]  Elisha Ponder, LPN Health Advisor, Embedded Care Coordination [facility] Health Care Management ??[email or web address] ??[phone]

## 2020-01-04 NOTE — Telephone Encounter (Signed)
Received fax from pharmacy, PA needed on duloxetine.  Clinical questions submitted via Cover My Meds.  Waiting on response, could take up to 72 hours.  Cover My Meds info: Key: [id number]  Jone Baseman, CMA

## 2020-01-07 NOTE — Telephone Encounter (Signed)
   Nothing attached, will like be faxed to MD with additional info. Jone Baseman, CMA

## 2020-01-08 NOTE — Telephone Encounter (Signed)
Patient form physician section completed. Placed in RN pool box for review.

## 2020-01-09 ENCOUNTER — Telehealth: Payer: Self-pay | Admitting: Family Medicine

## 2020-01-09 NOTE — Telephone Encounter (Signed)
There is not a referral for PT for this patient.  Will forward to MD to place one.  Reyonna Haack,CMA

## 2020-01-09 NOTE — Telephone Encounter (Signed)
Pt is calling to check on the status of having in home physical therapy. He said he has not heard from anyone and I did not see any new referrals placed for this. Pt would like for someone to call him to discuss.   The best call back number is [phone].

## 2020-01-09 NOTE — Telephone Encounter (Signed)
PA appeal faxed to appropriate number.

## 2020-01-09 NOTE — Telephone Encounter (Signed)
Home health services referral placed in December 2020. Unsure why patient has not been about to connect. Our CCM team has tried to reach out to him, but has been unsuccessful. This is likely in part due to language barrier (Patient speaks [place] and Hindi) and patient's near blindness. Patient has asked that services be coordinated through his daughter, Elmo Putt. I called Ms. Talati to inform her that of our ongoing efforts to get Mr. Lamountain his resources. I will also share this information with our SW Sammuel Hines who may reach out to Ms. Talati as well. Appreciate all the help from his care coordination teams.

## 2020-01-10 ENCOUNTER — Other Ambulatory Visit: Payer: Self-pay

## 2020-01-10 ENCOUNTER — Ambulatory Visit (INDEPENDENT_AMBULATORY_CARE_PROVIDER_SITE_OTHER): Payer: Medicare Other | Admitting: Family Medicine

## 2020-01-10 ENCOUNTER — Encounter: Payer: Self-pay | Admitting: Family Medicine

## 2020-01-10 DIAGNOSIS — M25552 Pain in left hip: Secondary | ICD-10-CM | POA: Diagnosis not present

## 2020-01-10 DIAGNOSIS — M25512 Pain in left shoulder: Secondary | ICD-10-CM | POA: Diagnosis not present

## 2020-01-10 DIAGNOSIS — M545 Low back pain, unspecified: Secondary | ICD-10-CM

## 2020-01-10 DIAGNOSIS — G8929 Other chronic pain: Secondary | ICD-10-CM | POA: Diagnosis not present

## 2020-01-10 DIAGNOSIS — H548 Legal blindness, as defined in USA: Secondary | ICD-10-CM

## 2020-01-10 NOTE — Progress Notes (Signed)
Office Visit Note   Patient: Mark Chase           Date of Birth: 1951/12/04           MRN: [id number] Visit Date: 01/10/2020 Requested by: [name], MD [address] [place],  [place] [address] PCP: [name], MD  Subjective: Chief Complaint  Patient presents with  . Left Shoulder - Pain  . Lower Back - Pain    Intermittent pain in the lower back, sometimes just on the left side, occasionally across the back. Occasional sharp pain in the left lower leg, lasting 10 seconds. H/o ESI - helped. Prednisone taper has helped. Would like MRI.    HPI: He is here with a few concerns.  Last visit I gave him a subacromial left shoulder injection.  It did not help much.  He has not yet had MRI scan.  His low back was bothering him but we did not have time to discuss it.  Since then he has been to a neurosurgeon who told him that surgery is not guaranteed to solve his problem.  He was referred to physical therapy but that has not happened yet.  He needs to do it at home because he is legally blind.  Today his left lateral hip bothers him the most.  It has been hurting for quite a while.  Pain at the greater trochanter with occasional pain down the leg toward the lateral calf.               ROS:   All other systems were reviewed and are negative.  Objective: Vital Signs: There were no vitals taken for this visit.  Physical Exam:  General:  Alert and oriented, in no acute distress. Pulm:  Breathing unlabored. Psy:  Normal mood, congruent affect.  Left shoulder: He has full active range of motion today.  He has pain with empty can test but he still has good strength with all rotator cuff testing.  No tenderness at the [facility] joint or in the subacromial space. Left hip: No pain with passive internal rotation.  He is point tender over the greater trochanter, posterior aspect.  Negative straight leg raise, lower extremity strength and reflexes are normal and equal.  Imaging:  None today  Assessment & Plan: 1.  Left hip greater trochanter syndrome -Steroid injection today.  Home health physical therapy.  2.  Chronic left shoulder pain with possible partial supraspinatus tear. -We will await MRI results. -Trial of [place] therapy in the meantime.  3.  Chronic low back pain -Trial of physical therapy.     Procedures: Left hip greater trochanter injection: After sterile prep with Betadine, injected 8 cc 1% lidocaine without epinephrine and 40 mg methylprednisolone into the area of maximum tenderness using a 22-gauge spinal needle.  He had very good immediate relief.    PMFS History: Patient Active Problem List   Diagnosis Date Noted  . Controlled type 2 diabetes mellitus with complication, without long-term current use of insulin ([facility]) 10/13/2019  . Lumbar radiculopathy, chronic 10/13/2019  . Hyperlipidemia 10/13/2019  . Polyneuropathy associated with critical illness ([facility]) 10/13/2019  . Essential hypertension 10/13/2019  . Blindness and low vision 10/13/2019  . Chronic left shoulder pain 10/13/2019  . Elevated hemoglobin A1c 10/13/2019  . Dyspnea 01/18/2012  . Abnormal EKG 01/18/2012  . Tobacco dipper 01/18/2012  . Cardiovascular risk factor 01/18/2012   Past Medical History:  Diagnosis Date  . Arthritis   . Diabetes  mellitus   . Hyperlipidemia   . Hypertension   . Umbilical hernia     Family History  Problem Relation Age of Onset  . Hypertension Mother 11  . Coronary artery disease Father 70    Past Surgical History:  Procedure Laterality Date  . INSERTION OF MESH N/A 08/28/2015   Procedure: INSERTION OF MESH;  Surgeon: Greer Pickerel, MD;  Location: [facility];  Service: General;  Laterality: N/A;  . SHOULDER SURGERY     Right  . UMBILICAL HERNIA REPAIR N/A 08/28/2015   Procedure: OPEN REPAIR UMBILICAL HERNIA;  Surgeon: Greer Pickerel, MD;  Location: [facility];  Service: General;  Laterality: N/A;   Social History   Occupational History  .  Occupation: Works for [employer] of the [employer]  Tobacco Use  . Smoking status: Never Smoker  . Smokeless tobacco: Current User    Types: Chew  Substance and Sexual Activity  . Alcohol use: Yes    Comment: OCCASIONAL  . Drug use: Not on file  . Sexual activity: Not on file

## 2020-01-11 ENCOUNTER — Ambulatory Visit: Payer: Medicare Other | Admitting: Family Medicine

## 2020-01-11 ENCOUNTER — Ambulatory Visit: Payer: Medicare Other | Admitting: Licensed Clinical Social Worker

## 2020-01-11 NOTE — Chronic Care Management (AMB) (Signed)
  Care Management   Clinical Social Work Consultation   01/11/2020 Name: Mark Chase MRN: [id number] DOB: 08/11/1951  Mark Chase is a 69 y.o. year old male who is a primary care patient of [name], MD . LCSW was consulted for assistance with assessing barriers that have delayed patient receiving home health PT.  LCSW reached out to Mark Chase's daughter today by phone to introduce self, assess needs and provide intervention.  Assessment: PCP placed [facility] referral for PT in Dec 2020. Patient was never contacted.  Patient works daily and utilizes [employer] for all transportation needs. Daughter would like [facility] PT for patient.  However is open to outpatient PT if [facility] is not approved. SDOH (Social Determinants of Health) screening performed : no other challenges identified: See care plan below.  Goals Addressed            This Visit's Progress   . Patient would like HH physical therapy       Current Barriers:  . Patient with HTN, DMII, and chronic pain needs support and care coordination in order to remove barriers to obtaining home health physical therapy  . PCP placed [facility] PT referral in Dec.patient has not received services.  Clinical Goal(s) :  Over the next 30 days,  care management team will coordinate and assist with removing barriers to care   Interventions provided by LCSW . Evaluation of current treatment plans as established by provider and barriers that have prevented patient from getting service when order was placed in Dec. . Collaborated with appropriate clinical care team members and Family Medicine referral coordinator for barriers to care. Referral sent to  [facility] and [facility] by referral coordinator. Jeanene Erb patient's daughter to assess for other needs and provide update   Patient Self Care Activities  . Patient is unable to independently self-manage chronic health conditions with physical therapy . Daughter is primary contact and will wait for an update on physical therapy  referral  Initial goal documentation     Review of patient status, including review of consultants reports, relevant laboratory and other test results, and collaboration with appropriate care team members and the patient's provider was performed as part of comprehensive patient evaluation and provision of care management services.    Plan: LCSW will review chart in one week to check on status of HH Referral   Sammuel Hines, LCSW Clinical Social Worker [facility] Family Medicine / [facility]   [phone] 12:20 PM

## 2020-01-11 NOTE — Chronic Care Management (AMB) (Signed)
  Chronic Care Management   Outreach Note  01/11/2020 Name: Mark Chase MRN: [id number] DOB: Aug 01, 1951  Mark Chase is a 69 y.o. year old male who is a primary care patient of [name], MD. I reached out to Mark Chase by phone today in response to a referral sent by Mark Chase's PCP, Dr. Fanny Bien     A second unsuccessful telephone outreach was attempted today. The patient was referred to the case management team for assistance with care management and care coordination.   Follow Up Plan: A HIPPA compliant phone message was left for the patient providing contact information and requesting a return call.  The care management team will reach out to the patient again over the next 7 days.  If patient returns call to provider office, please advise to call Embedded Care Management Care Guide Mark Ponder LPN at [phone]  Mark Ponder, LPN Health Advisor, Embedded Care Coordination [facility] Care Management ??[email or web address] ??[phone]

## 2020-01-11 NOTE — Patient Instructions (Signed)
Licensed Clinical Social Worker Visit Information Mr. Pauley  it was nice speaking with your daughter. Please call me directly if you have questions [phone] Goals we discussed today:  Goals Addressed            This Visit's Progress   . Patient would like HH physical therapy       Current Barriers:  . Patient with HTN, DMII, and chronic pain needs support and care coordination in order to remove barriers to obtaining home health physical therapy  . PCP placed [facility] PT referral in Dec.patient has not received services.  Clinical Goal(s) :  Over the next 30 days,  care management team will coordinate and assist with removing barriers to care   Interventions provided by LCSW . Evaluation of current treatment plans as established by provider and barriers that have prevented patient from getting service when order was placed in Dec. . Collaborated with appropriate clinical care team members and Family Medicine referral coordinator for barriers to care. Referral sent to  [facility] and [facility] by referral coordinator. Jeanene Erb patient's daughter to assess for other needs and provide update   Patient Self Care Activities  . Patient is unable to independently self-manage chronic health conditions with physical therapy . Daughter is primary contact and will wait for an update on physical therapy referral  Initial goal documentation      Materials provided:  Mr. Coonradt was given information about Care Management services today including:  1. Care Management services include personalized support from designated clinical staff supervised by his physician, including individualized plan of care and coordination with other care providers 2. 24/7 contact [phone] for assistance for urgent and routine care needs. 3. Care Management services at any time by phone call to the office staff.  Patient agreed to support provided today  The patient verbalized understanding of instructions provided today and  declined a print copy of patient instruction materials.  Follow up plan: no follow up by LCSW will check on status of referral placed    Soundra Pilon, LCSW

## 2020-01-13 ENCOUNTER — Other Ambulatory Visit: Payer: Self-pay | Admitting: Family Medicine

## 2020-01-13 DIAGNOSIS — G8929 Other chronic pain: Secondary | ICD-10-CM

## 2020-01-13 DIAGNOSIS — G6281 Critical illness polyneuropathy: Secondary | ICD-10-CM

## 2020-01-13 MED ORDER — DESVENLAFAXINE SUCCINATE ER 50 MG PO TB24: 50.0000 mg | ORAL_TABLET | Freq: Every day | ORAL | 3 refills | Status: DC

## 2020-01-16 ENCOUNTER — Ambulatory Visit: Payer: Self-pay

## 2020-01-16 NOTE — Chronic Care Management (AMB) (Signed)
   RN Case Manager Care Management Consultation  01/16/2020 Name: Mark Chase MRN: [id number] DOB: 19-Apr-1951   Mark Chase is a 69 y.o. year old male who sees [name], MD for primary care. RN Case Manager was consulted by Sammuel Hines LCSW for information /resources to assistance patient with  Care Coordination with Home Health Services. Patient was not seen during this encounter however RN Case Manager reviewed chart, notes, insurance etc...     Recommendation: After consultation with LCSW is determined that patient may benefit from [name] services.    Intervention: Relevant information and resources discussed and shared.   Plan:  RN Case Manager will help find a Home health agency that can provide services for the patient.  [name] RN, BSN, [facility] Care Management Coordinator [facility] Center Phone: [phone] Fax: [phone]

## 2020-01-17 ENCOUNTER — Ambulatory Visit: Payer: Self-pay | Admitting: Licensed Clinical Social Worker

## 2020-01-17 DIAGNOSIS — G8929 Other chronic pain: Secondary | ICD-10-CM

## 2020-01-17 DIAGNOSIS — M25512 Pain in left shoulder: Secondary | ICD-10-CM

## 2020-01-17 DIAGNOSIS — Z7189 Other specified counseling: Secondary | ICD-10-CM

## 2020-01-17 NOTE — Chronic Care Management (AMB) (Addendum)
  Social Work Care Management  Follow Up   01/17/2020 Name: Mark Chase MRN: [id number] DOB: 07-13-1951  Referred by: [name], MD Reason for referral : Care Coordination (for PT)  Mark Chase is a 69 y.o. year old male who is a primary care patient of [name], MD.  Reason for follow-up: Phone encounter with patient's daughter today for ongoing assessment  and care coordination needs related to meeting needs for HHPT.   Assessment: Patient has not been contacted by any [facility] agencies.  Daughter understands barriers due to patient's insurance and willing to do outpatient PT if that is the only option.   Review of patient status, including review of consultants reports, relevant laboratory and other test results, and collaboration with appropriate care team members and the patient's provider was performed as part of comprehensive patient evaluation and provision of chronic care management services.   Goals Addressed            This Visit's Progress   . Patient would like HH physical therapy       Current Barriers:  . Patient with HTN, DMII, and chronic pain needs support and care coordination in order to remove barriers to obtaining home health physical therapy  . PCP placed [facility] PT referral in Dec.patient has not received services due to [facility] agencies . Unable to find a HH agency that takes patiens insurance   Clinical Goal(s) :  Over the next 30 days,  care management team will coordinate and assist with removing barriers to care   Interventions provided by LCSW . Evaluation of current treatment plans as established by provider and barriers that have prevented patient from getting service when order was placed in Dec. . Collaborated with appropriate Family Medicine referral coordinator for barriers to care. Referral sent to  [facility] and [facility] by referral coordinator. Jeanene Erb patient's daughter to assess for other needs and provide update  . Collaborate with CCM RN care  manager for other [facility] options  Patient Self Care Activities  . Patient is unable to independently self-manage chronic health conditions with physical therapy . Daughter is primary contact and will wait for an update on physical therapy referral  Please see past updates related to this goal by clicking on the "Past Updates" button in the selected goal       Plan: LCSW will continue to collaborate with CCM RN care manager and PCP for patient's PT needs  Mark Hines, LCSW Clinical Social Worker [facility] Family Medicine / [facility]   [phone] 4:36 PM

## 2020-01-17 NOTE — Patient Instructions (Signed)
Licensed Clinical Social Worker Visit Information Mark Chase  it was nice speaking with your daughter to on your behalf. Please call me directly if you have questions [phone] Goals we discussed today:  Goals Addressed            This Visit's Progress   . Patient would like HH physical therapy       Current Barriers:  . Patient with HTN, DMII, and chronic pain needs support and care coordination in order to remove barriers to obtaining home health physical therapy  . PCP placed [facility] PT referral in Dec.patient has not received services due to [facility] agencies . Unable to find a HH agency that takes patiens insurance   Clinical Goal(s) :  Over the next 30 days,  care management team will coordinate and assist with removing barriers to care   Interventions provided by LCSW . Evaluation of current treatment plans as established by provider and barriers that have prevented patient from getting service when order was placed in Dec. . Collaborated with appropriate Family Medicine referral coordinator for barriers to care. Referral sent to  [facility] by referral coordinator. Jeanene Erb patient's daughter to assess for other needs and provide update  . Collaborate with CCM RN care manager for other [facility] options  Patient Self Care Activities  . Patient is unable to independently self-manage chronic health conditions with physical therapy . Daughter is primary contact and will wait for an update on physical therapy referral  Please see past updates related to this goal by clicking on the "Past Updates" button in the selected goal        Materials provided:  [name] was given information about Care Management services today including:  1. Care Management services include personalized support from designated clinical staff supervised by his physician, including individualized plan of care and coordination with other care providers 2. 24/7 contact [phone] for assistance for urgent and routine  care needs. 3. Care Management services at any time by phone call to the office staff.  The patient verbalized understanding of instructions provided today and declined a print copy of patient instruction materials.  Follow up plan:  SW will follow up with patient's daughter by phone over the next 5 to 7 days  Soundra Pilon, LCSW

## 2020-01-17 NOTE — Chronic Care Management (AMB) (Signed)
  Social Work Care Management  Outreach Note  01/17/2020 Name: Mark Chase MRN: [id number] DOB: Aug 07, 1951  Referred by: [name], MD Reason for referral : Care Coordination (for PT)  An unsuccessful telephone outreach was attempted today to patient's daughter to see if anyone has contacted her or if patient's PT has started.  Left voice message to call LCSW.  Follow Up Plan: LCSW will wait for return call  Sammuel Hines, LCSW Clinical Social Worker [facility] Family Medicine / [facility]   [phone] 9:20 AM

## 2020-01-18 ENCOUNTER — Other Ambulatory Visit: Payer: Self-pay | Admitting: Family Medicine

## 2020-01-18 ENCOUNTER — Ambulatory Visit: Payer: Self-pay

## 2020-01-18 ENCOUNTER — Telehealth: Payer: Self-pay

## 2020-01-18 DIAGNOSIS — M25552 Pain in left hip: Secondary | ICD-10-CM

## 2020-01-18 DIAGNOSIS — G8929 Other chronic pain: Secondary | ICD-10-CM

## 2020-01-18 NOTE — Chronic Care Management (AMB) (Signed)
  Care Management   Follow Up Note   01/18/2020 Name: Graves Nipp MRN: [id number] DOB: 25-Mar-1951  Referred by: [name], MD Reason for referral : Care Coordination (Physical Therapy)   Mark Chase is a 69 y.o. year old male who is a primary care patient of [name], MD. The care management team was consulted for assistance with care management and care coordination needs.    Review of patient status, including review of consultants reports, relevant laboratory and other test results, and collaboration with appropriate care team members and the patient's provider was performed as part of comprehensive patient evaluation and provision of chronic care management services.    SDOH (Social Determinants of Health) screening performed today: None. See Care Plan for related entries.   Advanced Directives: See Care Plan and Vynca application for related entries.  RN Case Manager spoke with the referral coordinator in the [facility] office.  She called and talked to the [facility] and the patient does not qualify for [facility] pyhsical therapy because he works full time.  They are planning on calling the patient and scheduling an appointment with the patient for physical therapy next week.   No further follow up required: from RN Case Management as patient will be schedule with Out Patient Physical Therapy  [name] RN, BSN, [facility] Care Management Coordinator [facility] Family Medicine Center Phone: [phone] Fax: [phone]

## 2020-01-18 NOTE — Telephone Encounter (Signed)
I called the patient and explained to him about the home physical therapy referral being denied, since he does leave the house to work every day. So, we have referred him to outpatient physical therapy. I advised him they would call him to set this up - he voiced understanding.

## 2020-02-04 ENCOUNTER — Ambulatory Visit: Payer: Medicare Other | Admitting: Physical Therapy

## 2020-02-05 ENCOUNTER — Ambulatory Visit: Payer: Medicare Other

## 2020-02-18 ENCOUNTER — Ambulatory Visit: Payer: Medicare Other | Attending: Family Medicine | Admitting: Physical Therapy

## 2020-02-18 ENCOUNTER — Other Ambulatory Visit: Payer: Self-pay

## 2020-02-18 ENCOUNTER — Encounter: Payer: Self-pay | Admitting: Physical Therapy

## 2020-02-18 DIAGNOSIS — M25512 Pain in left shoulder: Secondary | ICD-10-CM | POA: Diagnosis present

## 2020-02-18 DIAGNOSIS — M25552 Pain in left hip: Secondary | ICD-10-CM | POA: Diagnosis present

## 2020-02-18 DIAGNOSIS — M6281 Muscle weakness (generalized): Secondary | ICD-10-CM | POA: Insufficient documentation

## 2020-02-18 DIAGNOSIS — G8929 Other chronic pain: Secondary | ICD-10-CM

## 2020-02-18 NOTE — Therapy (Signed)
[facility] Outpatient Rehabilitation [facility] [address] [place], [place], [address] Phone: [phone]   Fax:  [phone]  Physical Therapy Evaluation  Patient Details  Name: Mark Chase MRN: [id number] Date of Birth: 07-07-1951 Referring Provider (PT): Lavada Mesi, MD   Encounter Date: 02/18/2020  PT End of Session - 02/18/20 1256    Visit Number  1    Number of Visits  13    Date for PT Re-Evaluation  03/31/20    Authorization Type  MCR: Kx at 15th visit    Progress Note Due on Visit  10    PT Start Time  1315    PT Stop Time  1400    PT Time Calculation (min)  45 min    Activity Tolerance  Patient tolerated treatment well    Behavior During Therapy  [facility] for tasks assessed/performed       Past Medical History:  Diagnosis Date  . Arthritis   . Diabetes mellitus   . Hyperlipidemia   . Hypertension   . Umbilical hernia     Past Surgical History:  Procedure Laterality Date  . INSERTION OF MESH N/A 08/28/2015   Procedure: INSERTION OF MESH;  Surgeon: Gaynelle Adu, MD;  Location: [facility] ORS;  Service: General;  Laterality: N/A;  . SHOULDER SURGERY     Right  . UMBILICAL HERNIA REPAIR N/A 08/28/2015   Procedure: OPEN REPAIR UMBILICAL HERNIA;  Surgeon: Gaynelle Adu, MD;  Location: [facility] ORS;  Service: General;  Laterality: N/A;    There were no vitals filed for this visit.   Subjective Assessment - 02/18/20 1305    Subjective  pt is a 69 y.o with CC of L shoulder, L hip and low back pain. the L shoulder pain started 10 years ago with no specific MOI. He reports the pain stays mostly in the shoulder. pt reports getting injection and reported no improvement in pain. pain in the L hip strarted about 5-6 years and reports referral down the LLE into the L ankle. since onset the pain seems to be worsening since onset.    Limitations  Lifting;Standing    How long can you sit comfortably?  unlimite    How long can you stand comfortably?  10 min    How long can you  walk comfortably?  10 min    Diagnostic tests  N/A    Patient Stated Goals  to decrease pain    Currently in Pain?  Yes    Pain Score  1    at worst 7/10   Pain Location  Shoulder    Pain Orientation  Left    Pain Descriptors / Indicators  Aching;Sore    Pain Type  Chronic pain    Aggravating Factors   lifting over head, reaching beheind head.    Pain Relieving Factors  n/a    Multiple Pain Sites  Yes    Pain Score  0   at worst 7/10   Pain Onset  More than a month ago    Pain Frequency  Intermittent    Aggravating Factors   standing/ alking    Pain Relieving Factors  sitting, moving the leg around         [facility] PT Assessment - 02/18/20 1301      Assessment   Medical Diagnosis  Pain in left hip M25.552, Chronic left shoulder pain M25.512, G89.29, Chronic bilateral low back pain, unspecified whether sciatica present M54.5, G89.29    Referring Provider (PT)  Hilts, Michael, MD    Onset Date/Surgical Date  --   L shoulder 10 years ago, L hip 5-6 years   Hand Dominance  Right    Next MD Visit  make on PRN     Prior Therapy  no      Precautions   Precautions  None      Restrictions   Weight Bearing Restrictions  No      Balance Screen   Has the patient fallen in the past 6 months  Yes      Home Environment   Living Environment  Private residence      Prior Function   Level of Independence  Independent with basic ADLs      Observation/Other Assessments   Focus on Therapeutic Outcomes (FOTO)   pt not appropriate due to speaking language than what FOTO provides      Posture/Postural Control   Posture/Postural Control  Postural limitations      ROM / Strength   AROM / PROM / Strength  AROM;Strength;PROM      AROM   Overall AROM Comments  L shoulder WFL    AROM Assessment Site  Lumbar;Shoulder;Hip    Right/Left Shoulder  Right;Left    Right/Left Hip  Right;Left      Strength   Strength Assessment Site  Hip;Shoulder    Right/Left Shoulder  Right;Left    Right  Shoulder Flexion  4+/5    Right Shoulder Extension  4+/5    Right Shoulder ABduction  4+/5    Right Shoulder Internal Rotation  4+/5    Right Shoulder External Rotation  4+/5    Left Shoulder Flexion  4/5   pain during testin    Left Shoulder Extension  4/5   pain during testin    Left Shoulder ABduction  4/5   pain during testin    Left Shoulder Internal Rotation  4+/5    Left Shoulder External Rotation  4/5   pain during testin    Right/Left Hip  Right;Left    Right Hip Flexion  5/5    Right Hip ABduction  4+/5    Left Hip Flexion  4+/5   pain during testin    Left Hip ABduction  4+/5      Palpation   Palpation comment  TTP along the sub-acromial space and A/C joint.   TTP at the greater trochanter and the piriformis and glute mediues                Objective measurements completed on examination: See above findings.              PT Education - 02/18/20 1300    Education Details  evaluation findings, POC, goals, HEP with proper form/ rationale.    Person(s) Educated  Patient    Methods  Explanation;Verbal cues;Handout    Comprehension  Verbalized understanding;Verbal cues required       PT Short Term Goals - 02/18/20 1410      PT SHORT TERM GOAL #1   Title  pt to be I with inital HEP    Time  3    Period  Weeks    Status  New    Target Date  03/10/20        PT Long Term Goals - 02/18/20 1410      PT LONG TERM GOAL #1   Title  pt to increase L shoulder gross strength to >/= 4+/5 in all planes with </= 1/10 pain  for functional strength    Time  6    Period  Weeks    Status  New    Target Date  03/31/20      PT LONG TERM GOAL #2   Title  pt to be able to walk/ stand for >/= 45 min with non report of hip and LLE referred pain    Time  6    Period  Weeks    Status  New    Target Date  03/31/20      PT LONG TERM GOAL #3   Title  pt to verbalize and demo efficient posture and lifting mechanics to reduce and prevent shoulder and back pain     Time  6    Period  Weeks    Status  New    Target Date  03/31/20      PT LONG TERM GOAL #4   Title  pt to be I with all HEP given as of last visit to maintain and progress current level of function    Time  6    Period  Weeks    Status  New    Target Date  03/31/20             Plan - 02/18/20 1307    Clinical Impression Statement  pt presents to OPPT with CC of L shoulder pain with no speicifc onset over 10 years ago and L hip pain starting 5-6 years ago. He has functional shoulder and hip ROM with weakness in the L shoulder secondary to pain specifically with flexion/abduction and ER. He has good hip strength but demonstrates TTP along the glute medius piriforms with concordant LLE referred pain during palpation. He would benefit from physical therapy to decrease hip and shoulder pain, increase general strength and maximize function by addressing the deficits listed.    Personal Factors and Comorbidities  Age;Comorbidity 2    Comorbidities  hx of DM, arthritis    Examination-Activity Limitations  Stand;Locomotion Level    Stability/Clinical Decision Making  Evolving/Moderate complexity    Clinical Decision Making  Moderate    PT Frequency  2x / week    PT Duration  6 weeks    PT Treatment/Interventions  ADLs/Self Care Home Management;Cryotherapy;Electrical Stimulation;Iontophoresis 4mg /ml Dexamethasone;Moist Heat;Traction;Ultrasound;Gait training;Stair training;Therapeutic activities;Therapeutic exercise;Balance training;Neuromuscular re-education;Manual techniques;Patient/family education;Passive range of motion;Taping;Dry needling    PT Next Visit Plan  review/ update HEP PRN, scapular stability, A/C joint mobs, STW along piriformis and glute med and stretching, modalities PRN    PT Home Exercise Plan  [id number] - upper trap stretch, rows, shoulder external rotation, piriformis stretch (in supine/ seated),    Consulted and Agree with Plan of Care  Patient       Patient will  benefit from skilled therapeutic intervention in order to improve the following deficits and impairments:  Improper body mechanics, Postural dysfunction, Decreased strength, Pain, Decreased activity tolerance, Decreased endurance  Visit Diagnosis: Chronic left shoulder pain  Pain in left hip  Muscle weakness (generalized)     Problem List Patient Active Problem List   Diagnosis Date Noted  . Controlled type 2 diabetes mellitus with complication, without long-term current use of insulin ([place]) 10/13/2019  . Lumbar radiculopathy, chronic 10/13/2019  . Hyperlipidemia 10/13/2019  . Polyneuropathy associated with critical illness ([place]) 10/13/2019  . Essential hypertension 10/13/2019  . Blindness and low vision 10/13/2019  . Chronic left shoulder pain 10/13/2019  . Elevated hemoglobin A1c 10/13/2019  . Dyspnea 01/18/2012  .  Abnormal EKG 01/18/2012  . Tobacco dipper 01/18/2012  . Cardiovascular risk factor 01/18/2012   Lulu Riding PT, DPT, LAT, ATC  02/18/20  2:14 PM      [facility] Outpatient Rehabilitation [facility] [address] [place], [place], [address] Phone: [phone]   Fax:  [phone]  Name: Mark Chase MRN: [id number] Date of Birth: [birth year]

## 2020-02-29 ENCOUNTER — Other Ambulatory Visit: Payer: Self-pay | Admitting: Family Medicine

## 2020-02-29 DIAGNOSIS — M5416 Radiculopathy, lumbar region: Secondary | ICD-10-CM

## 2020-03-04 ENCOUNTER — Ambulatory Visit: Payer: Medicare Other | Admitting: Physical Therapy

## 2020-03-04 ENCOUNTER — Other Ambulatory Visit: Payer: Self-pay

## 2020-03-04 DIAGNOSIS — M25512 Pain in left shoulder: Secondary | ICD-10-CM

## 2020-03-04 DIAGNOSIS — M25552 Pain in left hip: Secondary | ICD-10-CM

## 2020-03-04 DIAGNOSIS — M6281 Muscle weakness (generalized): Secondary | ICD-10-CM

## 2020-03-04 DIAGNOSIS — G8929 Other chronic pain: Secondary | ICD-10-CM

## 2020-03-04 NOTE — Therapy (Signed)
[place], [place], [address] Phone: [phone]   Fax:  [phone]  Physical Therapy Treatment  Patient Details  Name: Mark Chase MRN: [id number] Date of Birth: [birth year] Referring Provider (PT): Mark Blase, MD   Encounter Date: 03/04/2020  PT End of Session - 03/04/20 1551    Visit Number  2    Number of Visits  13    Date for PT Re-Evaluation  03/31/20    Authorization Type  MCR: Kx at 15th visit    PT Start Time  1545    PT Stop Time  1027   Patient only treated for 15 minutes the rest of the time was spent trying to make sense of the patients mental status and trying to get hinm checked out   PT Time Calculation (min)  60 min    Activity Tolerance  Patient tolerated treatment well    Behavior During Therapy  [facility] for tasks assessed/performed       Past Medical History:  Diagnosis Date  . Arthritis   . Diabetes mellitus   . Hyperlipidemia   . Hypertension   . Umbilical hernia     Past Surgical History:  Procedure Laterality Date  . INSERTION OF MESH N/A 08/28/2015   Procedure: INSERTION OF MESH;  Surgeon: Mark Pickerel, MD;  Location: [facility];  Service: General;  Laterality: N/A;  . SHOULDER SURGERY     Right  . UMBILICAL HERNIA REPAIR N/A 08/28/2015   Procedure: OPEN REPAIR UMBILICAL HERNIA;  Surgeon: Mark Pickerel, MD;  Location: [facility];  Service: General;  Laterality: N/A;    There were no vitals filed for this visit.  Subjective Assessment - 03/04/20 1651    Subjective  Patient found in the lobby asleep. When he awakend he was able to re-state his name and date of birth. He was borught back and a [place] interpretewr was used through [employer]. Th epatient stated his hip was his biggest problem today but was better. He reported the tennis ball helped the most. He was put into a supine position to work on stretching and strengthening of his hip. Please see assessment section.     Limitations  Lifting;Standing    How long can you sit comfortably?  unlimite    How long can you stand comfortably?  10 min    How long can you walk comfortably?  10 min    Diagnostic tests  N/A    Patient Stated Goals  to decrease pain    Currently in Pain?  --   unable to state a pain number but did state he was in pain                      [facility] Adult PT Treatment/Exercise - 03/04/20 0001      Self-Care   Self-Care  Other Self-Care Comments    Other Self-Care Comments   reviewed the improtance of strengthening and the resaoninbg behind PT. Patient appeared confused though and did not seem to follow.       Exercises   Exercises  Knee/Hip      Knee/Hip Exercises: Stretches   Piriformis Stretch Limitations  3x20 sec hold. Mod for technique through interpreter       Knee/Hip Exercises: Supine   Other Supine Knee/Hip Exercises  supine  march 2x10 cuing for tehcnique       Manual Therapy   Manual therapy comments  gentle LAD to reduce  hip pain. Improved IR with LAD.                PT Short Term Goals - 02/18/20 1410      PT SHORT TERM GOAL #1   Title  pt to be I with inital HEP    Time  3    Period  Weeks    Status  New    Target Date  03/10/20        PT Long Term Goals - 02/18/20 1410      PT LONG TERM GOAL #1   Title  pt to increase L shoulder gross strength to >/= 4+/5 in all planes with </= 1/10 pain for functional strength    Time  6    Period  Weeks    Status  New    Target Date  03/31/20      PT LONG TERM GOAL #2   Title  pt to be able to walk/ stand for >/= 45 min with non report of hip and LLE referred pain    Time  6    Period  Weeks    Status  New    Target Date  03/31/20      PT LONG TERM GOAL #3   Title  pt to verbalize and demo efficient posture and lifting mechanics to reduce and prevent shoulder and back pain    Time  6    Period  Weeks    Status  New    Target Date  03/31/20      PT LONG TERM GOAL #4   Title   pt to be I with all HEP given as of last visit to maintain and progress current level of function    Time  6    Period  Weeks    Status  New    Target Date  03/31/20            Plan - 03/04/20 1654    Clinical Impression Statement  Depsite education on what therapy was doing the patient consitently said the same thing. He satated he was here because he has pain when he was walking. He stated he came all the way over here to do exercises. When therapy attmepted to give him exercises he would start the same few statements about his hip. Therapy was only able to get through a few exercises. The interpretewr was having difficulty interpreting what he was saying.  The patient frequently fell asleep during the session and when he awaoke he would begin talking about how his hip hurts when he walks. The interpreter eventually sytated that he felt like the patient was confused which seemed to be the case to the therapist. The evaluating therapist talked to the patient and agreed the patients mental status was different then baseline. The patient reports he has been dizzy and tirede today. his B/P was measured at139/78. His HR was elevated to 120. The pateitn was advised he may want to be checked out at the hosotal for alterted mental status. The patient adamantly refused. The patients daughter was contacted. She was made aware that the pateint had an altered mental status and was advised to be checked out. She felt it was best the patient go home. She was made aware of the patients elevated HR and blood pressure. She is aware that the patient will be going home.    Personal Factors and Comorbidities  Age;Comorbidity 2    Comorbidities  hx of DM,  arthritis    Examination-Activity Limitations  Stand;Locomotion Level    Stability/Clinical Decision Making  Evolving/Moderate complexity    Clinical Decision Making  Moderate    PT Frequency  2x / week    PT Duration  6 weeks    PT Treatment/Interventions   ADLs/Self Care Home Management;Cryotherapy;Electrical Stimulation;Iontophoresis 4mg /ml Dexamethasone;Moist Heat;Traction;Ultrasound;Gait training;Stair training;Therapeutic activities;Therapeutic exercise;Balance training;Neuromuscular re-education;Manual techniques;Patient/family education;Passive range of motion;Taping;Dry needling    PT Next Visit Plan  review/ update HEP PRN, scapular stability, A/C joint mobs, STW along piriformis and glute med and stretching, modalities PRN    PT Home Exercise Plan  - upper trap stretch, rows, shoulder external rotation, piriformis stretch (in supine/ seated),    Consulted and Agree with Plan of Care  Patient       Patient will benefit from skilled therapeutic intervention in order to improve the following deficits and impairments:  Improper body mechanics, Postural dysfunction, Decreased strength, Pain, Decreased activity tolerance, Decreased endurance  Visit Diagnosis: Chronic left shoulder pain  Pain in left hip  Muscle weakness (generalized)     Problem List Patient Active Problem List   Diagnosis Date Noted  . Controlled type 2 diabetes mellitus with complication, without long-term current use of insulin (HCC) 10/13/2019  . Lumbar radiculopathy, chronic 10/13/2019  . Hyperlipidemia 10/13/2019  . Polyneuropathy associated with critical illness (HCC) 10/13/2019  . Essential hypertension 10/13/2019  . Blindness and low vision 10/13/2019  . Chronic left shoulder pain 10/13/2019  . Elevated hemoglobin A1c 10/13/2019  . Dyspnea 01/18/2012  . Abnormal EKG 01/18/2012  . Tobacco dipper 01/18/2012  . Cardiovascular risk factor 01/18/2012    01/20/2012 PT DPT  03/04/2020, 5:06 PM  [facility] [address] [place], [place], [place] Phone: [phone]   Fax:  [phone]  Name: Mark Chase MRN: Judd Lien Date of Birth: 1951-06-25

## 2020-03-11 ENCOUNTER — Ambulatory Visit: Payer: Medicare Other | Admitting: Physical Therapy

## 2020-03-11 ENCOUNTER — Encounter: Payer: Self-pay | Admitting: Physical Therapy

## 2020-03-11 ENCOUNTER — Other Ambulatory Visit: Payer: Self-pay

## 2020-03-11 DIAGNOSIS — M6281 Muscle weakness (generalized): Secondary | ICD-10-CM

## 2020-03-11 DIAGNOSIS — G8929 Other chronic pain: Secondary | ICD-10-CM

## 2020-03-11 DIAGNOSIS — M25512 Pain in left shoulder: Secondary | ICD-10-CM

## 2020-03-11 DIAGNOSIS — M25552 Pain in left hip: Secondary | ICD-10-CM

## 2020-03-12 NOTE — Therapy (Signed)
[place], [place], [address] Phone: [phone]   Fax:  [phone]  Physical Therapy Treatment  Patient Details  Name: Mark Chase MRN: [id number] Date of Birth: 12-31-1950 Referring Provider (PT): Eunice Blase, MD   Encounter Date: 03/11/2020  PT End of Session - 03/12/20 1434    Visit Number  3    Number of Visits  13    Date for PT Re-Evaluation  03/31/20    Authorization Type  MCR: Kx at 15th visit    PT Start Time  3009    PT Stop Time  1627    PT Time Calculation (min)  42 min       Past Medical History:  Diagnosis Date  . Arthritis   . Diabetes mellitus   . Hyperlipidemia   . Hypertension   . Umbilical hernia     Past Surgical History:  Procedure Laterality Date  . INSERTION OF MESH N/A 08/28/2015   Procedure: INSERTION OF MESH;  Surgeon: Greer Pickerel, MD;  Location: [facility];  Service: General;  Laterality: N/A;  . SHOULDER SURGERY     Right  . UMBILICAL HERNIA REPAIR N/A 08/28/2015   Procedure: OPEN REPAIR UMBILICAL HERNIA;  Surgeon: Greer Pickerel, MD;  Location: [facility];  Service: General;  Laterality: N/A;    There were no vitals filed for this visit.  Subjective Assessment - 03/11/20 1601    Subjective  Patients mental status is much beter today. Theray was able to understand him clearly without and interprpeter. he is much more lcear and not falling asleep consistnetly. He reports he was good for several days but last night his pain came back in his left hip. His left shoulder has been doing much better.    Limitations  Lifting;Standing    How long can you sit comfortably?  unlimite    How long can you stand comfortably?  10 min    How long can you walk comfortably?  10 min    Diagnostic tests  N/A    Patient Stated Goals  to decrease pain    Currently in Pain?  Yes    Pain Score  4     Pain Location  Hip    Pain Orientation  Left    Pain Descriptors / Indicators  Aching    Pain Type  Chronic pain    Pain Onset  More than a month ago    Pain Frequency  Constant    Aggravating Factors   standing , walking    Multiple Pain Sites  No                       [facility] Adult PT Treatment/Exercise - 03/12/20 0001      Knee/Hip Exercises: Stretches   Active Hamstring Stretch Limitations  seated with cuing for technique. Needs consitent cuing not to bounce     Piriformis Stretch Limitations  3x20 sec hold. Mod for technique through interpreter       Knee/Hip Exercises: Standing   Other Standing Knee Exercises  Standing march 2x10 patient reported some pain. Therapy returned patient to more manual therapy to reduce pain level     Other Standing Knee Exercises  forward weight shit 2x10      Knee/Hip Exercises: Supine   Bridges Limitations  2x10    Other Supine Knee/Hip Exercises  supine  march 2x10 cuing for tehcnique     Other Supine Knee/Hip Exercises  reviewed abdominal breathing: questionable carryover;       Manual Therapy   Manual Therapy  Joint mobilization;Soft tissue mobilization;Manual Traction    Joint Mobilization  PA mobilization of the left LE to imprvoe flexion     Soft tissue mobilization  to left hip and lower lumbar spine     Manual Traction  to left LE              PT Education - 03/11/20 1615    Education Details  moderate vc's and TC's for weightshift    Person(s) Educated  Patient    Methods  Explanation;Demonstration;Tactile cues;Verbal cues    Comprehension  Verbalized understanding;Returned demonstration;Verbal cues required;Tactile cues required;Need further instruction       PT Short Term Goals - 02/18/20 1410      PT SHORT TERM GOAL #1   Title  pt to be I with inital HEP    Time  3    Period  Weeks    Status  New    Target Date  03/10/20        PT Long Term Goals - 02/18/20 1410      PT LONG TERM GOAL #1   Title  pt to increase L shoulder gross strength to >/= 4+/5 in all planes with </= 1/10 pain  for functional strength    Time  6    Period  Weeks    Status  New    Target Date  03/31/20      PT LONG TERM GOAL #2   Title  pt to be able to walk/ stand for >/= 45 min with non report of hip and LLE referred pain    Time  6    Period  Weeks    Status  New    Target Date  03/31/20      PT LONG TERM GOAL #3   Title  pt to verbalize and demo efficient posture and lifting mechanics to reduce and prevent shoulder and back pain    Time  6    Period  Weeks    Status  New    Target Date  03/31/20      PT LONG TERM GOAL #4   Title  pt to be I with all HEP given as of last visit to maintain and progress current level of function    Time  6    Period  Weeks    Status  New    Target Date  03/31/20            Plan - 03/12/20 1439    Clinical Impression Statement  Patient did much better today. He was much more clear and able to communicatre in [place]. He did not flal asleep during the treatmnet. He tolerated treatment well initally but had some pain with standing exercises. Therapy perfromed manual therapy to get pain levels back down. He was given an updated HEp for mat ther-ex. He was not given stnding ther-ex for home 2nd to increased pain with standing march    Personal Factors and Comorbidities  Age;Comorbidity 2    Comorbidities  hx of DM, arthritis    Examination-Activity Limitations  Stand;Locomotion Level    Examination-Participation Restrictions  Meal Prep    PT Treatment/Interventions  ADLs/Self Care Home Management;Cryotherapy;Electrical Stimulation;Iontophoresis 4mg /ml Dexamethasone;Moist Heat;Traction;Ultrasound;Gait training;Stair training;Therapeutic activities;Therapeutic exercise;Balance training;Neuromuscular re-education;Manual techniques;Patient/family education;Passive range of motion;Taping;Dry needling    PT Next Visit Plan  review/ update HEP PRN, scapular stability, A/C joint mobs,  STW along piriformis and glute med and stretching, modalities PRN    PT Home  Exercise Plan  [phone] - upper trap stretch, rows, shoulder external rotation, piriformis stretch (in supine/ seated),    Consulted and Agree with Plan of Care  Patient       Patient will benefit from skilled therapeutic intervention in order to improve the following deficits and impairments:     Visit Diagnosis: Chronic left shoulder pain  Pain in left hip  Muscle weakness (generalized)     Problem List Patient Active Problem List   Diagnosis Date Noted  . Controlled type 2 diabetes mellitus with complication, without long-term current use of insulin (HCC) 10/13/2019  . Lumbar radiculopathy, chronic 10/13/2019  . Hyperlipidemia 10/13/2019  . Polyneuropathy associated with critical illness (HCC) 10/13/2019  . Essential hypertension 10/13/2019  . Blindness and low vision 10/13/2019  . Chronic left shoulder pain 10/13/2019  . Elevated hemoglobin A1c 10/13/2019  . Dyspnea 01/18/2012  . Abnormal EKG 01/18/2012  . Tobacco dipper 01/18/2012  . Cardiovascular risk factor 01/18/2012    Dessie Coma  PT DPT  03/12/2020, 2:49 PM  [facility] [address] [place], [place], [address] Phone: [phone]   Fax:  [phone]  Name: Mark Chase MRN: [id number] Date of Birth: 03-01-1951

## 2020-03-13 ENCOUNTER — Ambulatory Visit: Payer: Medicare Other | Admitting: Physical Therapy

## 2020-03-18 ENCOUNTER — Other Ambulatory Visit: Payer: Self-pay

## 2020-03-18 ENCOUNTER — Encounter: Payer: Self-pay | Admitting: Physical Therapy

## 2020-03-18 ENCOUNTER — Ambulatory Visit: Payer: Medicare Other | Admitting: Physical Therapy

## 2020-03-18 DIAGNOSIS — M6281 Muscle weakness (generalized): Secondary | ICD-10-CM

## 2020-03-18 DIAGNOSIS — M25512 Pain in left shoulder: Secondary | ICD-10-CM | POA: Diagnosis not present

## 2020-03-18 DIAGNOSIS — M25552 Pain in left hip: Secondary | ICD-10-CM

## 2020-03-18 DIAGNOSIS — G8929 Other chronic pain: Secondary | ICD-10-CM

## 2020-03-18 NOTE — Therapy (Signed)
[place], [place], [address] Phone: [phone]   Fax:  [phone]  Physical Therapy Treatment  Patient Details  Name: Mark Chase MRN: [id number] Date of Birth: 22-May-1951 Referring Provider (PT): Eunice Blase, MD   Encounter Date: 03/18/2020  PT End of Session - 03/18/20 1629    Visit Number  4    Number of Visits  13    Date for PT Re-Evaluation  03/31/20    Authorization Type  MCR: Kx at 15th visit    Progress Note Due on Visit  10    PT Start Time  1620    PT Stop Time  1700    PT Time Calculation (min)  40 min    Activity Tolerance  Patient tolerated treatment well    Behavior During Therapy  [facility] for tasks assessed/performed       Past Medical History:  Diagnosis Date  . Arthritis   . Diabetes mellitus   . Hyperlipidemia   . Hypertension   . Umbilical hernia     Past Surgical History:  Procedure Laterality Date  . INSERTION OF MESH N/A 08/28/2015   Procedure: INSERTION OF MESH;  Surgeon: Greer Pickerel, MD;  Location: [facility];  Service: General;  Laterality: N/A;  . SHOULDER SURGERY     Right  . UMBILICAL HERNIA REPAIR N/A 08/28/2015   Procedure: OPEN REPAIR UMBILICAL HERNIA;  Surgeon: Greer Pickerel, MD;  Location: [facility];  Service: General;  Laterality: N/A;    There were no vitals filed for this visit.  Subjective Assessment - 03/18/20 1533    Subjective  Patient reports his hip is 50% better and his shoulder is 90% better. He has been using ghtr etennis ball at home.    Limitations  Lifting;Standing    How long can you sit comfortably?  unlimite    How long can you stand comfortably?  10 min    Diagnostic tests  N/A    Patient Stated Goals  to decrease pain    Currently in Pain?  Yes    Pain Score  4     Pain Location  Hip    Pain Orientation  Left    Pain Descriptors / Indicators  Aching    Pain Type  Chronic pain    Pain Onset  More than a month ago    Pain Frequency   Constant    Aggravating Factors   standing and walking    Multiple Pain Sites  No                       [facility] Adult PT Treatment/Exercise - 03/19/20 0001      Knee/Hip Exercises: Stretches   Active Hamstring Stretch Limitations  seated with cuing for technique. Needs consitent cuing not to bounce     Piriformis Stretch Limitations  3x20 sec hold. Mod for technique through interpreter     Other Knee/Hip Stretches  lower trunk rotation       Knee/Hip Exercises: Standing   Other Standing Knee Exercises  Standing march 2x10 patient reported some pain. Therapy returned patient to more manual therapy to reduce pain level     Other Standing Knee Exercises  forward weight shit 2x10      Knee/Hip Exercises: Supine   Bridges Limitations  2x10    Other Supine Knee/Hip Exercises  supine  march 2x10 cuing for tehcnique     Other Supine Knee/Hip Exercises  reviewed  abdominal breathing: questionable carryover;       Manual Therapy   Manual Therapy  Joint mobilization;Soft tissue mobilization;Manual Traction    Joint Mobilization  PA mobilization of the left LE to imprvoe flexion     Soft tissue mobilization  to left hip and lower lumbar spine     Manual Traction  to left LE LAD grade III and IV ocillationbs             PT Education - 03/18/20 1534    Education Details  reviewed exercises for home.    Person(s) Educated  Patient    Methods  Explanation;Demonstration;Tactile cues;Verbal cues    Comprehension  Verbalized understanding;Returned demonstration;Verbal cues required;Tactile cues required       PT Short Term Goals - 03/19/20 0819      PT SHORT TERM GOAL #1   Title  pt to be I with inital HEP    Time  3    Period  Weeks    Status  New    Target Date  03/10/20        PT Long Term Goals - 02/18/20 1410      PT LONG TERM GOAL #1   Title  pt to increase L shoulder gross strength to >/= 4+/5 in all planes with </= 1/10 pain for functional strength    Time   6    Period  Weeks    Status  New    Target Date  03/31/20      PT LONG TERM GOAL #2   Title  pt to be able to walk/ stand for >/= 45 min with non report of hip and LLE referred pain    Time  6    Period  Weeks    Status  New    Target Date  03/31/20      PT LONG TERM GOAL #3   Title  pt to verbalize and demo efficient posture and lifting mechanics to reduce and prevent shoulder and back pain    Time  6    Period  Weeks    Status  New    Target Date  03/31/20      PT LONG TERM GOAL #4   Title  pt to be I with all HEP given as of last visit to maintain and progress current level of function    Time  6    Period  Weeks    Status  New    Target Date  03/31/20            Plan - 03/18/20 1629    Clinical Impression Statement  Patient had improved hip motion today but continues to have pain. He tolerated ther-ex better today. he reported no pain after ther-ex but in the lobby he reported his hip was hurting him. He had tenderness to palpation down his IT band and into his upper hip. He was encouraged through interpreter to use his stretches to decrease pain and and his exercises to keep the pain from coming back. Patient reports his shoulder is nealry better. He was advised to continue working on it at home and we will focus on the hip here if it continues to improve.    Comorbidities  hx of DM, arthritis    Examination-Activity Limitations  Stand;Locomotion Level    PT Treatment/Interventions  ADLs/Self Care Home Management;Cryotherapy;Electrical Stimulation;Iontophoresis 4mg /ml Dexamethasone;Moist Heat;Traction;Ultrasound;Gait training;Stair training;Therapeutic activities;Therapeutic exercise;Balance training;Neuromuscular re-education;Manual techniques;Patient/family education;Passive range of motion;Taping;Dry needling    PT Next  Visit Plan  review/ update HEP PRN, scapular stability, A/C joint mobs, STW along piriformis and glute med and stretching, modalities PRN    PT Home  Exercise Plan  [id number] - upper trap stretch, rows, shoulder external rotation, piriformis stretch (in supine/ seated),    Consulted and Agree with Plan of Care  Patient       Patient will benefit from skilled therapeutic intervention in order to improve the following deficits and impairments:  Improper body mechanics, Postural dysfunction, Decreased strength, Pain, Decreased activity tolerance, Decreased endurance  Visit Diagnosis: Chronic left shoulder pain  Pain in left hip  Muscle weakness (generalized)     Problem List Patient Active Problem List   Diagnosis Date Noted  . Controlled type 2 diabetes mellitus with complication, without long-term current use of insulin (HCC) 10/13/2019  . Lumbar radiculopathy, chronic 10/13/2019  . Hyperlipidemia 10/13/2019  . Polyneuropathy associated with critical illness (HCC) 10/13/2019  . Essential hypertension 10/13/2019  . Blindness and low vision 10/13/2019  . Chronic left shoulder pain 10/13/2019  . Elevated hemoglobin A1c 10/13/2019  . Dyspnea 01/18/2012  . Abnormal EKG 01/18/2012  . Tobacco dipper 01/18/2012  . Cardiovascular risk factor 01/18/2012    Dessie Coma PT DPT  03/19/2020, 8:20 AM  [facility] [address] [place], [place], [address] Phone: [phone]   Fax:  [phone]  Name: Kaine Mcquillen MRN: [id number] Date of Birth: [birth year]

## 2020-03-19 ENCOUNTER — Encounter: Payer: Self-pay | Admitting: Physical Therapy

## 2020-03-19 ENCOUNTER — Other Ambulatory Visit: Payer: Self-pay | Admitting: Family Medicine

## 2020-03-19 DIAGNOSIS — M25512 Pain in left shoulder: Secondary | ICD-10-CM

## 2020-03-19 DIAGNOSIS — G8929 Other chronic pain: Secondary | ICD-10-CM

## 2020-03-19 DIAGNOSIS — M5416 Radiculopathy, lumbar region: Secondary | ICD-10-CM

## 2020-03-20 ENCOUNTER — Encounter: Payer: Self-pay | Admitting: Physical Therapy

## 2020-03-20 ENCOUNTER — Ambulatory Visit: Payer: Medicare Other | Attending: Family Medicine | Admitting: Physical Therapy

## 2020-03-20 ENCOUNTER — Other Ambulatory Visit: Payer: Self-pay

## 2020-03-20 DIAGNOSIS — M25512 Pain in left shoulder: Secondary | ICD-10-CM | POA: Insufficient documentation

## 2020-03-20 DIAGNOSIS — M25552 Pain in left hip: Secondary | ICD-10-CM | POA: Insufficient documentation

## 2020-03-20 DIAGNOSIS — M6281 Muscle weakness (generalized): Secondary | ICD-10-CM | POA: Diagnosis present

## 2020-03-20 DIAGNOSIS — G8929 Other chronic pain: Secondary | ICD-10-CM | POA: Diagnosis present

## 2020-03-20 NOTE — Therapy (Signed)
[facility] [address] [place], [place], [address] Phone: [phone]   Fax:  [phone]  Physical Therapy Treatment  Patient Details  Name: Mark Chase MRN: [id number] Date of Birth: 03-02-1951 Referring Provider (PT): Lavada Mesi, MD   Encounter Date: 03/20/2020  PT End of Session - 03/20/20 1625    Visit Number  5    Number of Visits  13    Date for PT Re-Evaluation  03/31/20    Authorization Type  MCR: Kx at 15th visit    PT Start Time  1520    PT Stop Time  1604    PT Time Calculation (min)  44 min    Activity Tolerance  Patient tolerated treatment well    Behavior During Therapy  [facility] for tasks assessed/performed       Past Medical History:  Diagnosis Date  . Arthritis   . Diabetes mellitus   . Hyperlipidemia   . Hypertension   . Umbilical hernia     Past Surgical History:  Procedure Laterality Date  . INSERTION OF MESH N/A 08/28/2015   Procedure: INSERTION OF MESH;  Surgeon: Gaynelle Adu, MD;  Location: [facility];  Service: General;  Laterality: N/A;  . SHOULDER SURGERY     Right  . UMBILICAL HERNIA REPAIR N/A 08/28/2015   Procedure: OPEN REPAIR UMBILICAL HERNIA;  Surgeon: Gaynelle Adu, MD;  Location: [facility];  Service: General;  Laterality: N/A;    There were no vitals filed for this visit.  Subjective Assessment - 03/20/20 1545    Subjective  Patient reports minor pain today. He reports just having pain for short periods of time.    Limitations  Lifting;Standing    How long can you sit comfortably?  unlimite    How long can you stand comfortably?  10 min    How long can you walk comfortably?  10 min    Diagnostic tests  N/A    Patient Stated Goals  to decrease pain    Currently in Pain?  Yes   did not give pain number today   Pain Location  Hip    Pain Orientation  Left    Pain Descriptors / Indicators  Aching    Pain Type  Chronic pain    Pain Onset  More than a month ago    Pain Frequency  Constant     Aggravating Factors   standing and walking    Multiple Pain Sites  No                       [facility] Adult PT Treatment/Exercise - 03/20/20 0001      Knee/Hip Exercises: Stretches   Active Hamstring Stretch Limitations  seated with cuing for technique. Needs consitent cuing not to bounce     Piriformis Stretch Limitations  3x20 sec hold. Mod for technique through interpreter     Other Knee/Hip Stretches  lower trunk rotation       Knee/Hip Exercises: Standing   Other Standing Knee Exercises  Standing march 2x10 patient reported some pain.  standing hip abdution 2x10; standing hip extension  2x10       Knee/Hip Exercises: Supine   Bridges Limitations  2x10    Straight Leg Raises Limitations  2x10    Other Supine Knee/Hip Exercises  supine  march 2x10 cuing for tehcnique     Other Supine Knee/Hip Exercises  reviewed abdominal breathing: questionable carryover;  Manual Therapy   Manual Therapy  Joint mobilization;Soft tissue mobilization;Manual Traction    Joint Mobilization  PA mobilization of the left LE to imprvoe flexion     Soft tissue mobilization  to left hip and lower lumbar spine     Manual Traction  to left LE LAD grade III and IV ocillationbs             PT Education - 03/20/20 1624    Education Details  updated HEP    Person(s) Educated  Patient    Methods  Explanation;Demonstration;Tactile cues;Verbal cues    Comprehension  Verbalized understanding;Returned demonstration;Verbal cues required;Tactile cues required       PT Short Term Goals - 03/19/20 0819      PT SHORT TERM GOAL #1   Title  pt to be I with inital HEP    Time  3    Period  Weeks    Status  New    Target Date  03/10/20        PT Long Term Goals - 03/20/20 1645      PT LONG TERM GOAL #1   Title  pt to increase L shoulder gross strength to >/= 4+/5 in all planes with </= 1/10 pain for functional strength    Time  6    Period  Weeks    Status  New      PT LONG  TERM GOAL #2   Title  pt to be able to walk/ stand for >/= 45 min with non report of hip and LLE referred pain    Time  6    Period  Weeks    Status  On-going      PT LONG TERM GOAL #3   Title  pt to verbalize and demo efficient posture and lifting mechanics to reduce and prevent shoulder and back pain    Time  6    Period  Weeks    Status  On-going      PT LONG TERM GOAL #4   Title  pt to be I with all HEP given as of last visit to maintain and progress current level of function    Time  6    Period  Weeks    Status  On-going            Plan - 03/20/20 1626    Clinical Impression Statement  Therapy added SLR and standing 3 way hip to home program. he had some pain with standing march. He was advised when he feels pain to stop. He reports his pain only lasts a few seconds then goes away. He has minor spasming in his back today.    Personal Factors and Comorbidities  Age;Comorbidity 2    Comorbidities  hx of DM, arthritis    Examination-Activity Limitations  Stand;Locomotion Level    Examination-Participation Restrictions  Meal Prep    Stability/Clinical Decision Making  Evolving/Moderate complexity    Clinical Decision Making  Moderate    Rehab Potential  Good    PT Frequency  2x / week    PT Duration  6 weeks    PT Treatment/Interventions  ADLs/Self Care Home Management;Cryotherapy;Electrical Stimulation;Iontophoresis 4mg /ml Dexamethasone;Moist Heat;Traction;Ultrasound;Gait training;Stair training;Therapeutic activities;Therapeutic exercise;Balance training;Neuromuscular re-education;Manual techniques;Patient/family education;Passive range of motion;Taping;Dry needling    PT Next Visit Plan  review/ update HEP PRN, scapular stability, A/C joint mobs, STW along piriformis and glute med and stretching, modalities PRN    PT Home Exercise Plan  - upper trap  stretch, rows, shoulder external rotation, piriformis stretch (in supine/ seated),       Patient will benefit  from skilled therapeutic intervention in order to improve the following deficits and impairments:  Improper body mechanics, Postural dysfunction, Decreased strength, Pain, Decreased activity tolerance, Decreased endurance  Visit Diagnosis: Chronic left shoulder pain  Pain in left hip  Muscle weakness (generalized)     Problem List Patient Active Problem List   Diagnosis Date Noted  . Controlled type 2 diabetes mellitus with complication, without long-term current use of insulin ([place]) 10/13/2019  . Lumbar radiculopathy, chronic 10/13/2019  . Hyperlipidemia 10/13/2019  . Polyneuropathy associated with critical illness ([place]) 10/13/2019  . Essential hypertension 10/13/2019  . Blindness and low vision 10/13/2019  . Chronic left shoulder pain 10/13/2019  . Elevated hemoglobin A1c 10/13/2019  . Dyspnea 01/18/2012  . Abnormal EKG 01/18/2012  . Tobacco dipper 01/18/2012  . Cardiovascular risk factor 01/18/2012    Carney Living PT DPT  03/20/2020, 4:49 PM  [facility] [address] [place], [place], [address] Phone: [phone]   Fax:  [phone]  Name: Faysal Fenoglio MRN: [id number] Date of Birth: Oct 07, 1951

## 2020-03-25 ENCOUNTER — Other Ambulatory Visit: Payer: Self-pay

## 2020-03-25 ENCOUNTER — Encounter: Payer: Self-pay | Admitting: Physical Therapy

## 2020-03-25 ENCOUNTER — Ambulatory Visit: Payer: Medicare Other | Admitting: Physical Therapy

## 2020-03-25 DIAGNOSIS — M25512 Pain in left shoulder: Secondary | ICD-10-CM | POA: Diagnosis not present

## 2020-03-25 DIAGNOSIS — M25552 Pain in left hip: Secondary | ICD-10-CM

## 2020-03-25 DIAGNOSIS — M6281 Muscle weakness (generalized): Secondary | ICD-10-CM

## 2020-03-25 DIAGNOSIS — G8929 Other chronic pain: Secondary | ICD-10-CM

## 2020-03-25 NOTE — Therapy (Signed)
[place], [place], [address] Phone: [phone]   Fax:  [phone]  Physical Therapy Treatment  Patient Details  Name: Kramer Hanrahan MRN: [id number] Date of Birth: [birth year] Referring Provider (PT): Eunice Blase, MD   Encounter Date: 03/25/2020  PT End of Session - 03/25/20 1606    Visit Number  6    Number of Visits  13    Date for PT Re-Evaluation  03/31/20    Authorization Type  MCR: Kx at 15th visit    PT Start Time  1543    PT Stop Time  1622    PT Time Calculation (min)  39 min    Activity Tolerance  Patient tolerated treatment well    Behavior During Therapy  [facility] for tasks assessed/performed       Past Medical History:  Diagnosis Date  . Arthritis   . Diabetes mellitus   . Hyperlipidemia   . Hypertension   . Umbilical hernia     Past Surgical History:  Procedure Laterality Date  . INSERTION OF MESH N/A 08/28/2015   Procedure: INSERTION OF MESH;  Surgeon: Greer Pickerel, MD;  Location: [facility];  Service: General;  Laterality: N/A;  . SHOULDER SURGERY     Right  . UMBILICAL HERNIA REPAIR N/A 08/28/2015   Procedure: OPEN REPAIR UMBILICAL HERNIA;  Surgeon: Greer Pickerel, MD;  Location: [facility];  Service: General;  Laterality: N/A;    There were no vitals filed for this visit.  Subjective Assessment - 03/25/20 1550    Subjective  Patient reports his pain is better. He is having no pain today.    How long can you sit comfortably?  unlimite    How long can you walk comfortably?  10 min    Diagnostic tests  N/A    Patient Stated Goals  to decrease pain    Currently in Pain?  No/denies                       [facility] Adult PT Treatment/Exercise - 03/25/20 0001      Knee/Hip Exercises: Stretches   Piriformis Stretch Limitations  3x20 sec hold. Mod for technique through interpreter     Other Knee/Hip Stretches  lower trunk rotation       Knee/Hip Exercises: Supine   Bridges  Limitations  2x10    Straight Leg Raises Limitations  2x10    Other Supine Knee/Hip Exercises  supine  march 2x10 cuing for tehcnique     Other Supine Knee/Hip Exercises  supine clam 2x10 green       Manual Therapy   Manual therapy comments  manual gastroc stretch     Soft tissue mobilization  to left hip and lower lumbar spine     Manual Traction  to left LE LAD grade III and IV ocillationbs             PT Education - 03/25/20 1550    Education Details  treviewed technique with standing ther-ex    Person(s) Educated  Patient    Methods  Explanation;Demonstration;Verbal cues;Tactile cues    Comprehension  Verbalized understanding;Returned demonstration;Tactile cues required;Verbal cues required       PT Short Term Goals - 03/25/20 1628      PT SHORT TERM GOAL #1   Title  pt to be I with inital HEP    Time  3    Period  Weeks    Status  New  Target Date  03/10/20        PT Long Term Goals - 03/20/20 1645      PT LONG TERM GOAL #1   Title  pt to increase L shoulder gross strength to >/= 4+/5 in all planes with </= 1/10 pain for functional strength    Time  6    Period  Weeks    Status  New      PT LONG TERM GOAL #2   Title  pt to be able to walk/ stand for >/= 45 min with non report of hip and LLE referred pain    Time  6    Period  Weeks    Status  On-going      PT LONG TERM GOAL #3   Title  pt to verbalize and demo efficient posture and lifting mechanics to reduce and prevent shoulder and back pain    Time  6    Period  Weeks    Status  On-going      PT LONG TERM GOAL #4   Title  pt to be I with all HEP given as of last visit to maintain and progress current level of function    Time  6    Period  Weeks    Status  On-going            Plan - 03/25/20 1606    Clinical Impression Statement  Patient is making good progress. His hip paoin and motion have improved. He had no pain with exercises today. Therapy also reviewed shoulder exercises as he  has been mostly focused on the hip. He had no increase in pain.    Comorbidities  hx of DM, arthritis    Examination-Activity Limitations  Stand;Locomotion Level    Stability/Clinical Decision Making  Evolving/Moderate complexity    Clinical Decision Making  Moderate    Rehab Potential  Good    PT Frequency  2x / week    PT Duration  6 weeks    PT Treatment/Interventions  ADLs/Self Care Home Management;Cryotherapy;Electrical Stimulation;Iontophoresis 4mg /ml Dexamethasone;Moist Heat;Traction;Ultrasound;Gait training;Stair training;Therapeutic activities;Therapeutic exercise;Balance training;Neuromuscular re-education;Manual techniques;Patient/family education;Passive range of motion;Taping;Dry needling    PT Next Visit Plan  review/ update HEP PRN, scapular stability, A/C joint mobs, STW along piriformis and glute med and stretching, modalities PRN    PT Home Exercise Plan  - upper trap stretch, rows, shoulder external rotation, piriformis stretch (in supine/ seated),    Consulted and Agree with Plan of Care  Patient       Patient will benefit from skilled therapeutic intervention in order to improve the following deficits and impairments:  Improper body mechanics, Postural dysfunction, Decreased strength, Pain, Decreased activity tolerance, Decreased endurance  Visit Diagnosis: Chronic left shoulder pain  Pain in left hip  Muscle weakness (generalized)     Problem List Patient Active Problem List   Diagnosis Date Noted  . Controlled type 2 diabetes mellitus with complication, without long-term current use of insulin ([facility]) 10/13/2019  . Lumbar radiculopathy, chronic 10/13/2019  . Hyperlipidemia 10/13/2019  . Polyneuropathy associated with critical illness ([facility]) 10/13/2019  . Essential hypertension 10/13/2019  . Blindness and low vision 10/13/2019  . Chronic left shoulder pain 10/13/2019  . Elevated hemoglobin A1c 10/13/2019  . Dyspnea 01/18/2012  . Abnormal EKG  01/18/2012  . Tobacco dipper 01/18/2012  . Cardiovascular risk factor 01/18/2012    01/20/2012  PT DPT  03/25/2020, 4:30 PM  [facility] [address]  [address] [place], [place], [address] Phone: [phone]   Fax:  [phone]  Name: Jessejames Steelman MRN: [id number] Date of Birth: 11-23-1951

## 2020-03-26 ENCOUNTER — Other Ambulatory Visit: Payer: Self-pay | Admitting: *Deleted

## 2020-03-27 ENCOUNTER — Ambulatory Visit: Payer: Medicare Other | Admitting: Physical Therapy

## 2020-03-27 ENCOUNTER — Other Ambulatory Visit: Payer: Self-pay

## 2020-03-27 DIAGNOSIS — M6281 Muscle weakness (generalized): Secondary | ICD-10-CM

## 2020-03-27 DIAGNOSIS — G8929 Other chronic pain: Secondary | ICD-10-CM

## 2020-03-27 DIAGNOSIS — M25512 Pain in left shoulder: Secondary | ICD-10-CM

## 2020-03-27 DIAGNOSIS — M25552 Pain in left hip: Secondary | ICD-10-CM

## 2020-03-27 NOTE — Telephone Encounter (Signed)
Patient calls nurse line to check on status of rx refill.   Veronda Prude, RN

## 2020-03-28 ENCOUNTER — Encounter: Payer: Self-pay | Admitting: Physical Therapy

## 2020-03-28 NOTE — Therapy (Signed)
[place], [place], [address] Phone: [phone]   Fax:  [phone]  Physical Therapy Treatment/Discharge   Patient Details  Name: Mark Chase MRN: [id number] Date of Birth: 1951-08-06 Referring Provider (PT): Eunice Blase, MD   Encounter Date: 03/27/2020  PT End of Session - 03/28/20 0747    Visit Number  7    Number of Visits  13    Date for PT Re-Evaluation  03/31/20    Authorization Type  MCR: Kx at 15th visit    PT Start Time  1545    PT Stop Time  1615    PT Time Calculation (min)  30 min    Activity Tolerance  Patient tolerated treatment well    Behavior During Therapy  [facility] for tasks assessed/performed       Past Medical History:  Diagnosis Date  . Arthritis   . Diabetes mellitus   . Hyperlipidemia   . Hypertension   . Umbilical hernia     Past Surgical History:  Procedure Laterality Date  . INSERTION OF MESH N/A 08/28/2015   Procedure: INSERTION OF MESH;  Surgeon: Greer Pickerel, MD;  Location: [facility];  Service: General;  Laterality: N/A;  . SHOULDER SURGERY     Right  . UMBILICAL HERNIA REPAIR N/A 08/28/2015   Procedure: OPEN REPAIR UMBILICAL HERNIA;  Surgeon: Greer Pickerel, MD;  Location: [facility];  Service: General;  Laterality: N/A;    There were no vitals filed for this visit.  Subjective Assessment - 03/27/20 1553    Subjective  Patient is fursterated because he continues to have pain in his hip. He reports the pain comes and goes in his hip. This is to be expected. therapy tried to explain this to the patient but it is difficult because of the langauge barrier. The communication seems to be worse with the [place] interpreter.    Patient is accompained by:  Interpreter   attmepted to use [place] interpreter but the patient and interpreter did not seem to communicate well. He does better just talking slowly in [place].   How long can you sit comfortably?  unlimite    How long can you  stand comfortably?  10 min    How long can you walk comfortably?  10 min    Diagnostic tests  N/A    Patient Stated Goals  to decrease pain    Currently in Pain?  No/denies    Pain Orientation  Left    Pain Descriptors / Indicators  Aching    Pain Type  Chronic pain    Pain Frequency  Constant    Aggravating Factors   stnaidng and walking    Multiple Pain Sites  No                       [facility] Adult PT Treatment/Exercise - 03/28/20 0001      Self-Care   Other Self-Care Comments   therapy talked with patient about progression of strengthening and progression of PT.       Knee/Hip Exercises: Stretches   Piriformis Stretch Limitations  3x20 sec hold. Mod for technique through interpreter     Other Knee/Hip Stretches  lower trunk rotation       Manual Therapy   Soft tissue mobilization  to left hip and lower lumbar spine     Manual Traction  to left LE LAD grade III and IV ocillationbs  PT Education - 03/27/20 1628    Education Details  importance of exercise    Person(s) Educated  Patient    Methods  Explanation;Demonstration;Tactile cues;Verbal cues    Comprehension  Verbalized understanding;Returned demonstration;Verbal cues required;Tactile cues required       PT Short Term Goals - 03/28/20 0759      PT SHORT TERM GOAL #1   Title  pt to be I with inital HEP    Time  3    Status  On-going    Target Date  03/10/20        PT Long Term Goals - 03/28/20 0759      PT LONG TERM GOAL #1   Title  pt to increase L shoulder gross strength to >/= 4+/5 in all planes with </= 1/10 pain for functional strength    Baseline  improved shoulder strength but has not reached goal    Time  6    Period  Weeks    Status  Not Met      PT LONG TERM GOAL #2   Title  pt to be able to walk/ stand for >/= 45 min with non report of hip and LLE referred pain    Time  6    Period  Weeks    Status  Not Met      PT LONG TERM GOAL #3   Title  pt to verbalize  and demo efficient posture and lifting mechanics to reduce and prevent shoulder and back pain    Time  6    Period  Weeks    Status  Achieved      PT LONG TERM GOAL #4   Title  pt to be I with all HEP given as of last visit to maintain and progress current level of function    Period  Weeks    Status  Achieved            Plan - 03/28/20 0748    Clinical Impression Statement  Patients shoulder has improved and it appears that his hip is improving but the patient deffers further treatment. He reports that he has pain intermittently. Therapy attempted to explain to the patient that genneraly the pain will come and go but decrease in intensity and duration. It was difficult to tell if he fully understood 2nd to langauge barrier. Therapy has used a [place] interpreter but the communication was worse with the interpreter. The patient feels like he has had this pain for 10 years and that therapy will not help. He is requesting to return to the MD. Therapy will D/C at this time to HEP. Therapy perfromed manual therapy and attmepted to explain to him the progression of therapy. He declined to perfrom ther-ex today.    Personal Factors and Comorbidities  Age;Comorbidity 2    Comorbidities  hx of DM, arthritis    Examination-Activity Limitations  Stand;Locomotion Level    Examination-Participation Restrictions  Meal Prep    Clinical Decision Making  Moderate    Rehab Potential  Good    PT Frequency  2x / week    PT Duration  6 weeks    PT Treatment/Interventions  ADLs/Self Care Home Management;Cryotherapy;Electrical Stimulation;Iontophoresis 79m/ml Dexamethasone;Moist Heat;Traction;Ultrasound;Gait training;Stair training;Therapeutic activities;Therapeutic exercise;Balance training;Neuromuscular re-education;Manual techniques;Patient/family education;Passive range of motion;Taping;Dry needling    PT Next Visit Plan  review/ update HEP PRN, scapular stability, A/C joint mobs, STW along piriformis and  glute med and stretching, modalities PRN    PT Home Exercise  Plan  [id number] - upper trap stretch, rows, shoulder external rotation, piriformis stretch (in supine/ seated),    Consulted and Agree with Plan of Care  Patient       Patient will benefit from skilled therapeutic intervention in order to improve the following deficits and impairments:  Improper body mechanics, Postural dysfunction, Decreased strength, Pain, Decreased activity tolerance, Decreased endurance  Visit Diagnosis: Chronic left shoulder pain  Pain in left hip  Muscle weakness (generalized)   PHYSICAL THERAPY DISCHARGE SUMMARY  Visits from Start of Care:   Current functional level related to goals / functional outcomes: 7   Remaining deficits: Continued pain when standing and walking    Education / Equipment: HEP    Plan: Patient agrees to discharge.  Patient goals were not met. Patient is being discharged due to lack of progress.  ?????       Problem List Patient Active Problem List   Diagnosis Date Noted  . Controlled type 2 diabetes mellitus with complication, without long-term current use of insulin ([place]) 10/13/2019  . Lumbar radiculopathy, chronic 10/13/2019  . Hyperlipidemia 10/13/2019  . Polyneuropathy associated with critical illness ([place]) 10/13/2019  . Essential hypertension 10/13/2019  . Blindness and low vision 10/13/2019  . Chronic left shoulder pain 10/13/2019  . Elevated hemoglobin A1c 10/13/2019  . Dyspnea 01/18/2012  . Abnormal EKG 01/18/2012  . Tobacco dipper 01/18/2012  . Cardiovascular risk factor 01/18/2012    Carney Living 03/28/2020, 8:04 AM  [facility] [address] [place], [place], [address] Phone: [phone]   Fax:  [phone]  Name: Jaceion Aday MRN: [id number] Date of Birth: March 14, 1951

## 2020-04-01 ENCOUNTER — Encounter: Payer: Medicare Other | Admitting: Physical Therapy

## 2020-04-03 ENCOUNTER — Encounter: Payer: Medicare Other | Admitting: Physical Therapy

## 2020-04-09 ENCOUNTER — Ambulatory Visit (INDEPENDENT_AMBULATORY_CARE_PROVIDER_SITE_OTHER): Payer: Medicare Other | Admitting: Family Medicine

## 2020-04-09 ENCOUNTER — Encounter: Payer: Self-pay | Admitting: Family Medicine

## 2020-04-09 ENCOUNTER — Other Ambulatory Visit: Payer: Self-pay

## 2020-04-09 VITALS — BP 130/66 | HR 96 | Ht 66.0 in | Wt 164.0 lb

## 2020-04-09 DIAGNOSIS — I10 Essential (primary) hypertension: Secondary | ICD-10-CM | POA: Diagnosis not present

## 2020-04-09 DIAGNOSIS — E785 Hyperlipidemia, unspecified: Secondary | ICD-10-CM | POA: Diagnosis not present

## 2020-04-09 DIAGNOSIS — M5442 Lumbago with sciatica, left side: Secondary | ICD-10-CM | POA: Diagnosis not present

## 2020-04-09 DIAGNOSIS — E118 Type 2 diabetes mellitus with unspecified complications: Secondary | ICD-10-CM

## 2020-04-09 DIAGNOSIS — G8929 Other chronic pain: Secondary | ICD-10-CM

## 2020-04-09 LAB — POCT GLYCOSYLATED HEMOGLOBIN (HGB A1C): HbA1c, POC (controlled diabetic range): 7 % (ref 0.0–7.0)

## 2020-04-09 MED ORDER — FENOFIBRATE 145 MG PO TABS: 145.0000 mg | ORAL_TABLET | Freq: Every day | ORAL | 3 refills | Status: AC

## 2020-04-09 MED ORDER — EMPAGLIFLOZIN 25 MG PO TABS: 25.0000 mg | ORAL_TABLET | Freq: Every day | ORAL | 3 refills | Status: DC

## 2020-04-09 MED ORDER — GLIPIZIDE ER 10 MG PO TB24: 10.0000 mg | ORAL_TABLET | Freq: Every day | ORAL | 3 refills | Status: DC

## 2020-04-09 MED ORDER — METFORMIN HCL 1000 MG PO TABS: 1000.0000 mg | ORAL_TABLET | Freq: Two times a day (BID) | ORAL | 3 refills | Status: DC

## 2020-04-09 MED ORDER — LOSARTAN POTASSIUM 50 MG PO TABS: 50.0000 mg | ORAL_TABLET | Freq: Every day | ORAL | 3 refills | Status: DC

## 2020-04-09 MED ORDER — OXYCODONE-ACETAMINOPHEN 10-325 MG PO TABS: 1.0000 | ORAL_TABLET | ORAL | 0 refills | Status: DC | PRN

## 2020-04-09 NOTE — Assessment & Plan Note (Signed)
Chronic lower back pain.  Not controlled with multimodal approach.  -Short trial of opioids -Referral to pain management clinic

## 2020-04-09 NOTE — Progress Notes (Cosign Needed)
    SUBJECTIVE:   CHIEF COMPLAINT / HPI:   Interpretive services were offered, however patient declined the interpreter.  Chronic low back pain Patient has history of chronic low back pain.  This is associated with chronic left hip pain and left shoulder pain.  He is on multimodal pain control including SNRI, NSAID, gabapentin.  He has baseline renal dysfunction CKD 3.  He also has diabetes.  He has tried several sessions of physical therapy recently without any improvement.  He has recently had to retire due to his joint pain including his shoulder and back pain.  Back pain limits his ability to be mobile.  He does ambulate without a cane.  The back pain radiates down the right side of his leg.  It is a 6-7 out of 10 continuously.  He has been seen by both orthopedics and neurosurgery.  He feels that his pain is being overlooked.  He is also not a good surgical candidate per neurosurgery.  Patient is hesitant to take NSAIDs because there "bad for my kidneys".  Is interested in trying something else.  Discussed with him that the next step for opioids.  Discussed that given his complexity would likely need a referral to pain management.  He would like a medication bridge until he gets his appointment.  Was very clear that medication we prescribed for him was only a bridge Until he can establish with pain management.  Patient denies any fevers or chills, urine or fecal incontinence, saddle anesthesia, new limb weakness.  Diabetes mellitus-follows with [facility] endocrinology. Hemoglobin A1c well controlled at 7.  Continue current regimen  Medication refill Patient with multiple medications refilled as below.  PERTINENT  PMH / PSH: CKD 3 Hyperlipidemia  OBJECTIVE:   BP 130/66   Pulse 96   Ht 5\' 6"  (1.676 m)   Wt 164 lb (74.4 kg)   SpO2 100%   BMI 26.47 kg/m    Gen: NAD, resting comfortably Pulm: NWOB MSK: no LEX edema, cyanosis, or clubbing noted Skin: warm, dry Neuro: grossly  normal, moves all extremities Psych: Normal affect and thought content  ASSESSMENT/PLAN:   Chronic low back pain with left-sided sciatica Chronic lower back pain.  Not controlled with multimodal approach.  -Short trial of opioids -Referral to pain management clinic   Medications refilled as requested.  , MD [facility]

## 2020-04-09 NOTE — Patient Instructions (Signed)
We will refill your medications as requested.   For your back pain, we started you on a short course of oxycodone while you are awaiting your appointment for pain management.

## 2020-04-21 ENCOUNTER — Other Ambulatory Visit: Payer: Self-pay

## 2020-04-22 MED ORDER — TRAZODONE HCL 50 MG PO TABS: 50.0000 mg | ORAL_TABLET | Freq: Every day | ORAL | 0 refills | Status: AC

## 2020-05-02 ENCOUNTER — Emergency Department (HOSPITAL_COMMUNITY): Payer: Medicare Other

## 2020-05-02 ENCOUNTER — Encounter (HOSPITAL_COMMUNITY): Payer: Self-pay

## 2020-05-02 ENCOUNTER — Other Ambulatory Visit: Payer: Self-pay

## 2020-05-02 ENCOUNTER — Emergency Department (HOSPITAL_COMMUNITY)
Admission: EM | Admit: 2020-05-02 | Discharge: 2020-05-02 | Disposition: A | Discharge status: Home / Self Care | Payer: Medicare Other | Attending: Emergency Medicine | Admitting: Emergency Medicine

## 2020-05-02 DIAGNOSIS — I10 Essential (primary) hypertension: Secondary | ICD-10-CM | POA: Insufficient documentation

## 2020-05-02 DIAGNOSIS — F10920 Alcohol use, unspecified with intoxication, uncomplicated: Secondary | ICD-10-CM

## 2020-05-02 DIAGNOSIS — Z79899 Other long term (current) drug therapy: Secondary | ICD-10-CM | POA: Diagnosis not present

## 2020-05-02 DIAGNOSIS — Z7984 Long term (current) use of oral hypoglycemic drugs: Secondary | ICD-10-CM | POA: Insufficient documentation

## 2020-05-02 DIAGNOSIS — F1020 Alcohol dependence, uncomplicated: Secondary | ICD-10-CM

## 2020-05-02 DIAGNOSIS — E119 Type 2 diabetes mellitus without complications: Secondary | ICD-10-CM | POA: Diagnosis not present

## 2020-05-02 DIAGNOSIS — Y9389 Activity, other specified: Secondary | ICD-10-CM | POA: Diagnosis not present

## 2020-05-02 DIAGNOSIS — Y999 Unspecified external cause status: Secondary | ICD-10-CM | POA: Insufficient documentation

## 2020-05-02 DIAGNOSIS — Y92512 Supermarket, store or market as the place of occurrence of the external cause: Secondary | ICD-10-CM | POA: Diagnosis not present

## 2020-05-02 DIAGNOSIS — F17220 Nicotine dependence, chewing tobacco, uncomplicated: Secondary | ICD-10-CM | POA: Insufficient documentation

## 2020-05-02 DIAGNOSIS — F10129 Alcohol abuse with intoxication, unspecified: Secondary | ICD-10-CM | POA: Insufficient documentation

## 2020-05-02 DIAGNOSIS — Y908 Blood alcohol level of 240 mg/100 ml or more: Secondary | ICD-10-CM | POA: Insufficient documentation

## 2020-05-02 DIAGNOSIS — R410 Disorientation, unspecified: Secondary | ICD-10-CM | POA: Diagnosis present

## 2020-05-02 DIAGNOSIS — R0789 Other chest pain: Secondary | ICD-10-CM | POA: Insufficient documentation

## 2020-05-02 LAB — CBC WITH DIFFERENTIAL/PLATELET
Abs Immature Granulocytes: 0.05 10*3/uL (ref 0.00–0.07)
Basophils Absolute: 0.1 10*3/uL (ref 0.0–0.1)
Basophils Relative: 1 %
Eosinophils Absolute: 0 10*3/uL (ref 0.0–0.5)
Eosinophils Relative: 1 %
HCT: 45.2 % (ref 39.0–52.0)
Hemoglobin: 15.3 g/dL (ref 13.0–17.0)
Immature Granulocytes: 1 %
Lymphocytes Relative: 38 %
Lymphs Abs: 2.7 10*3/uL (ref 0.7–4.0)
MCH: 29.1 pg (ref 26.0–34.0)
MCHC: 33.8 g/dL (ref 30.0–36.0)
MCV: 86.1 fL (ref 80.0–100.0)
Monocytes Absolute: 0.8 10*3/uL (ref 0.1–1.0)
Monocytes Relative: 12 %
Neutro Abs: 3.4 10*3/uL (ref 1.7–7.7)
Neutrophils Relative %: 47 %
Platelets: 187 10*3/uL (ref 150–400)
RBC: 5.25 MIL/uL (ref 4.22–5.81)
RDW: 14.9 % (ref 11.5–15.5)
WBC: 7 10*3/uL (ref 4.0–10.5)
nRBC: 0.3 % — ABNORMAL HIGH (ref 0.0–0.2)

## 2020-05-02 LAB — COMPREHENSIVE METABOLIC PANEL
ALT: 36 U/L (ref 0–44)
AST: 46 U/L — ABNORMAL HIGH (ref 15–41)
Albumin: 4.1 g/dL (ref 3.5–5.0)
Alkaline Phosphatase: 47 U/L (ref 38–126)
Anion gap: 21 — ABNORMAL HIGH (ref 5–15)
BUN: 14 mg/dL (ref 8–23)
CO2: 19 mmol/L — ABNORMAL LOW (ref 22–32)
Calcium: 9.5 mg/dL (ref 8.9–10.3)
Chloride: 100 mmol/L (ref 98–111)
Creatinine, Ser: 1.21 mg/dL (ref 0.61–1.24)
GFR calc Af Amer: >60 mL/min (ref >60)
GFR calc non Af Amer: >60 mL/min (ref >60)
Glucose, Bld: 146 mg/dL — ABNORMAL HIGH (ref 70–99)
Potassium: 4.1 mmol/L (ref 3.5–5.1)
Sodium: 140 mmol/L (ref 135–145)
Total Bilirubin: 1.6 mg/dL — ABNORMAL HIGH (ref 0.3–1.2)
Total Protein: 7.4 g/dL (ref 6.5–8.1)

## 2020-05-02 LAB — CBG MONITORING, ED: Glucose-Capillary: 144 mg/dL — ABNORMAL HIGH (ref 70–99)

## 2020-05-02 LAB — POCT I-STAT EG7
Acid-base deficit: 3 mmol/L — ABNORMAL HIGH (ref 0.0–2.0)
Bicarbonate: 19 mmol/L — ABNORMAL LOW (ref 20.0–28.0)
Calcium, Ion: 1.01 mmol/L — ABNORMAL LOW (ref 1.15–1.40)
HCT: 44 % (ref 39.0–52.0)
Hemoglobin: 15 g/dL (ref 13.0–17.0)
O2 Saturation: 97 %
Potassium: 4.1 mmol/L (ref 3.5–5.1)
Sodium: 139 mmol/L (ref 135–145)
TCO2: 20 mmol/L — ABNORMAL LOW (ref 22–32)
pCO2, Ven: 27 mmHg — ABNORMAL LOW (ref 44.0–60.0)
pH, Ven: 7.455 — ABNORMAL HIGH (ref 7.250–7.430)
pO2, Ven: 80 mmHg — ABNORMAL HIGH (ref 32.0–45.0)

## 2020-05-02 LAB — ETHANOL: Alcohol, Ethyl (B): 348 mg/dL (ref <10)

## 2020-05-02 MED ORDER — CHLORDIAZEPOXIDE HCL 25 MG PO CAPS
ORAL_CAPSULE | ORAL | 0 refills | Status: DC
Start: 2020-05-02 — End: 2020-09-17

## 2020-05-02 NOTE — ED Provider Notes (Signed)
[facility] EMERGENCY DEPARTMENT Provider Note   CSN: [id number] Arrival date & time: 05/02/20  1542     History No chief complaint on file.   Mark Chase is a 69 y.o. male.  HPI He presents by EMS for evaluation of confusion and a fall.  He was apparently at a [employer], when he was found on the floor next to a motorized scooter.  Patient was able to get himself back into the scooter.  Bystanders/staff noticed that he was confused and called EMS.  Patient's daughter was contacted and informed ED staff that she wanted to get him into rehab for alcohol abuse.  Patient speaks Gujar[place], and attempt to communicate with him was done by audio translation with a Adult nurse.  It appeared that the patient was hard of hearing during this conversation.  Also the translator had difficulty getting the patient to respond initially.  Ultimately the patient did respond somewhat and stated that he was here for chest pain, that started today, and that he was at home before the pain started.  He states that he lives alone.  He is unable to give any other pertinent history.  Level 5 caveat-altered mental status    Past Medical History:  Diagnosis Date  . Arthritis   . Diabetes mellitus   . Hyperlipidemia   . Hypertension   . Umbilical hernia     Patient Active Problem List   Diagnosis Date Noted  . Chronic low back pain with left-sided sciatica 04/09/2020  . Controlled type 2 diabetes mellitus with complication, without long-term current use of insulin (HCC) 10/13/2019  . Lumbar radiculopathy, chronic 10/13/2019  . Hyperlipidemia 10/13/2019  . Polyneuropathy associated with critical illness (HCC) 10/13/2019  . Essential hypertension 10/13/2019  . Blindness and low vision 10/13/2019  . Chronic left shoulder pain 10/13/2019  . Tobacco dipper 01/18/2012  . Cardiovascular risk factor 01/18/2012    Past Surgical History:  Procedure Laterality Date  . INSERTION  OF MESH N/A 08/28/2015   Procedure: INSERTION OF MESH;  Surgeon: Gaynelle Adu, MD;  Location: [facility];  Service: General;  Laterality: N/A;  . SHOULDER SURGERY     Right  . UMBILICAL HERNIA REPAIR N/A 08/28/2015   Procedure: OPEN REPAIR UMBILICAL HERNIA;  Surgeon: Gaynelle Adu, MD;  Location: [facility];  Service: General;  Laterality: N/A;       Family History  Problem Relation Age of Onset  . Hypertension Mother 60  . Coronary artery disease Father 24    Social History   Tobacco Use  . Smoking status: Never Smoker  . Smokeless tobacco: Current User    Types: Chew  Substance Use Topics  . Alcohol use: Yes  . Drug use: Never    Home Medications Prior to Admission medications   Medication Sig Start Date End Date Taking? Authorizing Provider  aspirin 81 MG tablet Take 81 mg by mouth daily.    [provider]  chlordiazePOXIDE (LIBRIUM) 25 MG capsule 50mg  PO TID x 1D, then 25-50mg  PO BID X 1D, then 25-50mg  PO QD X 1D 05/02/20   05/04/20, MD  diclofenac Sodium (VOLTAREN) 1 % GEL APPLY  4 GRAMS OF GEL TOPICALLY TO AFFECTED AREA 4 TIMES DAILY 03/22/20   05/22/20, MD  DULoxetine (CYMBALTA) 20 MG capsule Take 1 capsule by mouth once daily 03/22/20   05/22/20, MD  empagliflozin (JARDIANCE) 25 MG TABS tablet Take 25 mg by mouth daily. 04/09/20   04/11/20,  MD  fenofibrate (TRICOR) 145 MG tablet Take 1 tablet (145 mg total) by mouth daily. 04/09/20   [name], MD  gabapentin (NEURONTIN) 300 MG capsule Take 1 capsule (300 mg total) by mouth 2 (two) times daily. 10/12/19 11/11/19  [name], MD  glipiZIDE (GLUCOTROL XL) 10 MG 24 hr tablet Take 1 tablet (10 mg total) by mouth daily. 04/09/20   [name], MD  losartan (COZAAR) 50 MG tablet Take 1 tablet (50 mg total) by mouth daily. 04/09/20 04/04/21  [name], MD  metFORMIN (GLUCOPHAGE) 1000 MG tablet Take 1 tablet (1,000 mg total) by mouth 2 (two) times daily with a meal. 04/09/20 07/08/20   [name], MD  oxyCODONE-acetaminophen (PERCOCET) 10-325 MG tablet Take 1 tablet by mouth every 4 (four) hours as needed for pain. 04/09/20   [name], MD  pravastatin (PRAVACHOL) 40 MG tablet Take 2 tablets (80 mg total) by mouth daily. 10/12/19   [name], MD  traZODone (DESYREL) 50 MG tablet Take 1 tablet (50 mg total) by mouth at bedtime. 04/22/20   [name], MD    Allergies    Lisinopril  Review of Systems   Review of Systems  Unable to perform ROS: Mental status change    Physical Exam Updated Vital Signs BP 140/79   Pulse (!) 112   Temp 97.7 F (36.5 C) (Oral)   Resp 18   SpO2 97%   Physical Exam Vitals and nursing note reviewed.  Constitutional:      General: He is not in acute distress.    Appearance: He is well-developed. He is not ill-appearing, toxic-appearing or diaphoretic.  HENT:     Head: Normocephalic and atraumatic.     Right Ear: External ear normal.     Left Ear: External ear normal.     Nose: No congestion or rhinorrhea.     Mouth/Throat:     Pharynx: No oropharyngeal exudate or posterior oropharyngeal erythema.  Eyes:     Conjunctiva/sclera: Conjunctivae normal.     Pupils: Pupils are equal, round, and reactive to light.  Neck:     Trachea: Phonation normal.  Cardiovascular:     Rate and Rhythm: Normal rate and regular rhythm.     Heart sounds: Normal heart sounds.  Pulmonary:     Effort: Pulmonary effort is normal.     Breath sounds: Normal breath sounds.  Abdominal:     General: There is no distension.     Palpations: Abdomen is soft.     Tenderness: There is no abdominal tenderness.  Musculoskeletal:        General: Normal range of motion.     Cervical back: Normal range of motion and neck supple.  Skin:    General: Skin is warm and dry.  Neurological:     Mental Status: He is alert.     Cranial Nerves: No cranial nerve deficit.     Sensory: No sensory deficit.     Motor: No abnormal muscle tone.      Coordination: Coordination normal.     Comments: Dysarthria is present.  No aphasia.  Unable to ascertain if the patient is hard of hearing.  He speaks very loudly.  Psychiatric:        Attention and Perception: He is inattentive.        Mood and Affect: Mood is depressed.        Speech: Speech is delayed.  Behavior: Behavior is slowed.        Thought Content: Thought content is not paranoid or delusional.        Cognition and Memory: Cognition is impaired.        Judgment: Judgment is inappropriate.     ED Results / Procedures / Treatments   Labs (all labs ordered are listed, but only abnormal results are displayed) Labs Reviewed  COMPREHENSIVE METABOLIC PANEL - Abnormal; Notable for the following components:      Result Value   CO2 19 (*)    Glucose, Bld 146 (*)    AST 46 (*)    Total Bilirubin 1.6 (*)    Anion gap 21 (*)    All other components within normal limits  ETHANOL - Abnormal; Notable for the following components:   Alcohol, Ethyl (B) 348 (*)    All other components within normal limits  CBC WITH DIFFERENTIAL/PLATELET - Abnormal; Notable for the following components:   nRBC 0.3 (*)    All other components within normal limits  CBG MONITORING, ED - Abnormal; Notable for the following components:   Glucose-Capillary 144 (*)    All other components within normal limits  POCT I-STAT EG7 - Abnormal; Notable for the following components:   pH, Ven 7.455 (*)    pCO2, Ven 27.0 (*)    pO2, Ven 80.0 (*)    Bicarbonate 19.0 (*)    TCO2 20 (*)    Acid-base deficit 3.0 (*)    Calcium, Ion 1.01 (*)    All other components within normal limits  BLOOD GAS, VENOUS    EKG EKG Interpretation  Date/Time:  Friday May 02 2020 15:51:06 EDT Ventricular Rate:  116 PR Interval:    QRS Duration: 88 QT Interval:  336 QTC Calculation: 467 R Axis:   -69 Text Interpretation: Sinus tachycardia Probable left atrial enlargement Left anterior fascicular block Abnormal R-wave  progression, late transition ST elevation, consider inferior injury Baseline wander in lead(s) V3 No old tracing to compare Confirmed by Daleen Bo [phone]) on 05/02/2020 5:00:08 PM   Radiology DG Chest Portable 1 View  Result Date: 05/02/2020 CLINICAL DATA:  Chest pain EXAM: PORTABLE CHEST 1 VIEW COMPARISON:  Radiograph 10/21/2006 FINDINGS: Low lung volumes with streaky basilar opacities favoring atelectasis. Central vascular crowding. No consolidation, features of edema, pneumothorax, or effusion. The aorta is calcified. The remaining cardiomediastinal contours are unremarkable. No acute osseous or soft tissue abnormality. Degenerative changes are present in the imaged spine and shoulders. IMPRESSION: Low lung volumes with streaky basilar opacities favoring atelectasis. Electronically Signed   By: Lovena Le M.D.   On: 05/02/2020 17:07    Procedures Procedures (including critical care time)  Medications Ordered in ED Medications - No data to display  ED Course  I have reviewed the triage vital signs and the nursing notes.  Pertinent labs & imaging results that were available during my care of the patient were reviewed by me and considered in my medical decision making (see chart for details).  Clinical Course as of May 02 2104  Fri May 02, 2020  1602 [phone] daughters number   [KM]  1700 Perihilar infiltrate, right greater than left, interpreted by me  DG Chest Portable 1 View [EW]  1740 Abnormal, markedly elevated  Ethanol(!!) [EW]  1741 High   [EW]  1741 Normal except CO2 low, glucose high, total bilirubin high, anion gap elevated  Comprehensive metabolic panel(!) [EW]  8546 Normal  CBC with Differential(!) [EW]  1742  Normal except pH high, PCO2 low, PO2 high, bicarb low, total CO2 low, ionized calcium low  POCT I-Stat EG7(!) [EW]  1910 Patient removed his monitoring leads and IV, and and told the nursing want to leave the ED.  He is now able to communicate in  [place].  I was also able to ambulate to the bathroom and back on his own.  I discussed the situation with his daughter on the telephone, she states that she will not come to pick the patient up.  She wants to have him sober up on his own, and come home by taxi.   [EW]    Clinical Course User Index [EW] Mancel Bale, MD [KM] Jeral Pinch   MDM Rules/Calculators/A&P                       Patient Vitals for the past 24 hrs:  BP Temp Temp src Pulse Resp SpO2  05/02/20 1856 140/79 -- -- (!) 112 18 97 %  05/02/20 1745 131/74 -- -- (!) 110 17 95 %  05/02/20 1630 111/71 -- -- (!) 114 15 95 %  05/02/20 1604 -- 97.7 F (36.5 C) Oral -- -- --  05/02/20 1602 -- -- -- -- (!) 25 --  05/02/20 1600 132/78 -- -- (!) 115 -- 97 %    9:05 PM Reevaluation with update and discussion. After initial assessment and treatment, an updated evaluation reveals patient is now more alert and speaking English very well.  He is conversant and reasonable.  He is able to ambulate without ataxia.  He states he understands why he was held so long, and agrees that he was very drunk.  Findings discussed with the patient and all questions were answered. Mancel Bale   Medical Decision Making:  This patient is presenting for evaluation of altered mental status, which does require a range of treatment options, and is a complaint that involves a high risk of morbidity and mortality. The differential diagnoses include delirium, intoxication, acute illness. I decided to review old records, and in summary elderly male with history of diabetes, and reported to be a heavy alcohol user.  CBG elevated on arrival.  Patient is difficult to understand.  I obtained additional historical information from his daughter.  Clinical Laboratory Tests Ordered, included CBC, Metabolic panel, Urinalysis and Alcohol level, venous blood gas. Review indicates marked alcohol intoxication, remainder reassuring.   Critical  Interventions-clinical evaluation, observation, laboratory testing, discussion with family member, daughter on telephone, reevaluation.  After These Interventions, the Patient was reevaluated and was found to have recovered his sensorium, now able to speak in [place].  Very high alcohol level, at 4 PM however he is lucid at the time of discharge.  No history of significant alcohol withdrawal syndrome.  Patient will be given a prescription of Librium to use if needed for alcohol craving.  He will also be instructed to follow-up for treatment of alcohol abuse at a treatment facility.  Additionally I have consulted peer support who hopefully will contact him and help him work with his alcoholism.  Patient is going home in a hired driver car, either taxi or Lyft.  CRITICAL CARE-no Performed by: Mancel Bale  Nursing Notes Reviewed/ Care Coordinated Applicable Imaging Reviewed Interpretation of Laboratory Data incorporated into ED treatment  The patient appears reasonably screened and/or stabilized for discharge and I doubt any other medical condition or other [facility] requiring further screening, evaluation, or treatment in the ED at this  time prior to discharge.  Plan: Home Medications-continue usual; Home Treatments-avoid alcohol; return here if the recommended treatment, does not improve the symptoms; Recommended follow up-PCP of choice.  Peer support, alcohol abuse facilities for counseling and treatment as needed     Final Clinical Impression(s) / ED Diagnoses Final diagnoses:  Alcoholic intoxication without complication (HCC)  Alcoholism (HCC)    Rx / DC Orders ED Discharge Orders         Ordered    chlordiazePOXIDE (LIBRIUM) 25 MG capsule     05/02/20 2103           Mancel BaleWentz, Guliana Weyandt, MD 05/02/20 2113

## 2020-05-02 NOTE — ED Notes (Signed)
Pt alert, stating that he wants to go home; Dr. Effie Shy notified

## 2020-05-02 NOTE — ED Triage Notes (Signed)
Pt from [facility] via ems;  picked up prescriptions, using [employer]'s motorized scooter, made purchase at 2:15, found at 2:40 on floor of produce department; pt got self back onto motorized scooter; pt combative on ems arrival; pt's daughter endorses hx of etoh abuse; pt alert to voice on arrival    97% RA HR 120 150/100 CBG 191 T 98.76F RR 20   Elmo Putt (daughter) [phone]

## 2020-05-02 NOTE — ED Notes (Signed)
Pt has continues to remove monitoring devices (BP cuff, ekg leads, pulse ox) despite being directed to keep them on

## 2020-05-02 NOTE — Discharge Instructions (Addendum)
You were evaluated today for altered mental status after he fell at [facility].  Your alcohol level was very high, at 348 mg/dL.  This level is so high that it will not be normal until 9 AM tomorrow.  This indicates that you are an alcoholic and abusing alcohol.  Try to avoid drinking all forms of alcohol.  We have provided a prescription of Librium to take to help you avoid the cravings of alcohol.  Please use it as directed if you feel the need to drink alcohol or have alcohol withdrawal symptoms.  It is important for you to follow-up and get help for alcoholism with Alcoholics Anonymous, or treatment center of your choice to help avoid using alcohol.  Please see the attached information to help you find a venue to work with your alcoholism.

## 2020-05-02 NOTE — ED Notes (Signed)
Using interpreter service pt verbalized understanding of d/c instructions and prescriptions. Pt had no further questions at this time. Pt able to ambulate unassisted. Pt transported to exit via wheelchair.

## 2020-05-02 NOTE — ED Notes (Signed)
Pt ambulatory to and from restroom  

## 2020-05-02 NOTE — ED Notes (Signed)
Using interpreter, pt endorses CP that started "some time ago today"; denies etoh use, denies use of medications; states he was at home prior to coming to ED

## 2020-05-14 ENCOUNTER — Ambulatory Visit (INDEPENDENT_AMBULATORY_CARE_PROVIDER_SITE_OTHER): Payer: Medicare Other | Admitting: Family Medicine

## 2020-05-14 ENCOUNTER — Encounter: Payer: Self-pay | Admitting: Family Medicine

## 2020-05-14 ENCOUNTER — Other Ambulatory Visit: Payer: Self-pay

## 2020-05-14 VITALS — BP 92/58 | HR 75 | Ht 66.0 in | Wt 167.0 lb

## 2020-05-14 DIAGNOSIS — M545 Low back pain: Secondary | ICD-10-CM

## 2020-05-14 DIAGNOSIS — G8929 Other chronic pain: Secondary | ICD-10-CM | POA: Diagnosis not present

## 2020-05-14 DIAGNOSIS — F1010 Alcohol abuse, uncomplicated: Secondary | ICD-10-CM | POA: Diagnosis not present

## 2020-05-14 MED ORDER — NALTREXONE HCL 50 MG PO TABS: 50.0000 mg | ORAL_TABLET | Freq: Every day | ORAL | 1 refills | Status: DC

## 2020-05-14 NOTE — Progress Notes (Signed)
    SUBJECTIVE:   CHIEF COMPLAINT / HPI:   Patient is here to follow-up on alcohol use disorder and chronic back pain.  Alcohol use disorder He is accompanied by his daughter and his wife.  His daughter acted as Equities trader.  She declined professional interpretive services.  Since last visit, patient was seen in ED for elevated alcohol level.  He endorses drinking 2 small bottles of gin a night.  His wife says that she has found several bottles more than this in his home.  He does not give a reason for why he is drinking more.  Per his daughter, he has a long history of getting drunk and getting easily angered.  He is not violent.  Upon discharge of the ED patient was sent home with a course of Librium.  Patient is not been taking it as prescribed.  His last dose was taken yesterday.  Daughter reports that he seems more tired than usual, but he is not had any sweats or other complaints.  Patient is interested in quitting alcohol.  His last drink was 10 days ago.  Chronic back pain Patient history of chronic back pain. He says his pain was improved with the oxycodone.  Discussed the dangers of taking opioids and alcohol.  Discussed the need to discontinue opioids due to increased fall risk and oversedation.  He wishes to have further consult with a another Careers adviser.  He is requesting a referral to [facility].  Denies any new numbness, weakness, loss of bowel or bladder incontinence.   OBJECTIVE:   BP (!) 92/58 Comment: provider informed  Pulse 75   Ht 5\' 6"  (1.676 m)   Wt 167 lb (75.8 kg)   SpO2 95%   BMI 26.95 kg/m    Gen: NAD, resting comfortably CV: RRR with no murmurs appreciated Pulm: NWOB, CTAB with no crackles, wheezes, or rhonchi GI: Soft, Nontender, Nondistended. MSK: 1+ pitting edema bilaterally to midshin Skin: warm, dry Neuro: grossly normal, moves all extremities Psych: Normal affect and thought content   ASSESSMENT/PLAN:   Alcohol use disorder, mild,  abuse Longstanding issue.  Multiple attempts to quit.  Finished Librium taper, last drink was 10 days ago, unlikely withdrawal. -Plan to discontinue oxycodone. -Plan to start naltrexone -Follow-up in 2 weeks   Chronic bilateral low back pain without sciatica Discontinuing opioids. -Referral to orthopedic spine for further assessment     , MD [facility]

## 2020-05-14 NOTE — Assessment & Plan Note (Signed)
Discontinuing opioids. -Referral to orthopedic spine for further assessment

## 2020-05-14 NOTE — Assessment & Plan Note (Signed)
Longstanding issue.  Multiple attempts to quit.  Finished Librium taper, last drink was 10 days ago, unlikely withdrawal. -Plan to discontinue oxycodone. -Plan to start naltrexone -Follow-up in 2 weeks

## 2020-05-14 NOTE — Patient Instructions (Addendum)
Stop taking the oxycodone for back pain.  Please wait till Friday, for the Librium to leave your system.  Start naltrexone for alcohol use disorder.   Follow-up in 2 weeks to discuss alcohol use.

## 2020-06-02 ENCOUNTER — Encounter: Payer: Self-pay | Admitting: Family Medicine

## 2020-06-02 ENCOUNTER — Ambulatory Visit (INDEPENDENT_AMBULATORY_CARE_PROVIDER_SITE_OTHER): Payer: Medicare Other | Admitting: Family Medicine

## 2020-06-02 ENCOUNTER — Other Ambulatory Visit: Payer: Self-pay

## 2020-06-02 VITALS — BP 120/60 | HR 79 | Ht 66.0 in | Wt 162.2 lb

## 2020-06-02 DIAGNOSIS — Z789 Other specified health status: Secondary | ICD-10-CM

## 2020-06-02 DIAGNOSIS — F1010 Alcohol abuse, uncomplicated: Secondary | ICD-10-CM

## 2020-06-02 DIAGNOSIS — M545 Low back pain: Secondary | ICD-10-CM | POA: Diagnosis not present

## 2020-06-02 DIAGNOSIS — E118 Type 2 diabetes mellitus with unspecified complications: Secondary | ICD-10-CM | POA: Diagnosis not present

## 2020-06-02 DIAGNOSIS — E785 Hyperlipidemia, unspecified: Secondary | ICD-10-CM

## 2020-06-02 DIAGNOSIS — G8929 Other chronic pain: Secondary | ICD-10-CM

## 2020-06-02 DIAGNOSIS — Z7289 Other problems related to lifestyle: Secondary | ICD-10-CM | POA: Diagnosis not present

## 2020-06-02 NOTE — Progress Notes (Signed)
    SUBJECTIVE:   CHIEF COMPLAINT / HPI:   Alcohol use disorder  Patient's daughter is acting as an Equities trader.  They declined professional interpreter services.  Patient follow-up for alcohol use disorder. Has been on medicine in the past in [place] for alcohol use.  Says that he will do well for a while and then is like he can stop the medication but when he does start drinking again.  Patient recently started naltrexone.  At first when he was taking the medicine it made him a little sleepy, so the family held off on giving it to him.  But he started taking it again and is tolerating it without any side effects.  Daughter reports that he is less violet and angery.  He has not been drinking any alcohol for the past few weeks. Daughter and wife have been accompany him wherever he goes to make sure he does drink.  However, but this is not a permanent solution due to family availability to be with him all the time.  He reports that his alcohol craving are down.  He is also interested in trying counseling solution.  Chronic back pain Patient complaint of chronic back pain.  Not worsening.  Did have discontinue tramadol at last visit once it was discovered the patient had alcohol use disorder.  Patient is requesting referral to orthopedics for possible surgical revision.  This referral was placed last month.  We will follow up on the status of this.  Also provided contact information to patient for this.  We have also placed a referral to pain management.  We will also give the patient this contact information to follow-up on this as well.  CKD 3 with diabetes mellitus type 2 Diabetes well controlled. Follows with Endocrine. However does need renal check.    Hyperlipidemia Currently on pravastatin and fenofibrate.  Does need to lipid panel rechecked.    OBJECTIVE:   BP 120/60   Pulse 79   Ht 5\' 6"  (1.676 m)   Wt 162 lb 4 oz (73.6 kg)   SpO2 97%   BMI 26.19 kg/m   Gen: NAD, resting  comfortably CV: RRR with no murmurs appreciated Pulm: NWOB, CTAB with no crackles, wheezes, or rhonchi GI: Normal bowel sounds present. Soft, Nontender, Nondistended. MSK: no edema, cyanosis, or clubbing noted Skin: warm, dry Neuro: grossly normal, moves all extremities Psych: Normal affect and thought content   ASSESSMENT/PLAN:   Controlled diabetes mellitus type 2 with complications (HCC) Recheck CMP  Chronic bilateral low back pain without sciatica Replaced referral to pain management clinic. Will reach out to referral coordinator regarding Orthopedics referral.   Hyperlipidemia Recheck lipid panel. May need a mod/high intensity statin, if not intolerant. Can likely d/c fenofibrate.   Alcohol use disorder, mild, abuse Improved since starting naltrexone. Continue. Also provided local support and counseling resources, although, may be a challenge with language barriar.      , MD [facility]

## 2020-06-02 NOTE — Patient Instructions (Addendum)
[facility]:  Hulda Marin MD [address] [place] [address]   [phone]  [facility] Address: [address], [place], [place] [address] Phone: [phone]  Substance Use - Private Insurance   [facility]: Residential and outpatient treatment services for substance abuse and co-occurring psychological disorders  Phone: [phone].  Address: [address] [place], [place]  Website: [email or web address]   [facility]: Outpatient treatment services for substance abuse and co-occurring psychological disorders  Phone: [phone]  Address: [address], [place]  Website: http://www.[facility].com/[email or web address]-rehab/   [facility]: Outpatient substance abuse treatment for teenagers and young adults, out-of-network with major insurance companies  Phone: [phone]  Address: [phone] [place], [place]  Website: BedroomRental.com.ee    Substance Use - Medicaid or No Insurance  [facility]: Substance abuse treatment, group therapy, and medication-assisted treatment  Phone: [phone].  Address: [address], [place]  Website: http://www.adsyes.org/  New patients should "walk in." [place] walk-in hours are M & W 12:00-2:30; F 11:00-1:30.   [facility]: Substance abuse treatment, therapy for children and adults, psychiatric services  Phone: [phone].  Address: [address], [place]  Website: https://[facility].[email or web address]/  Patient calls for an appointment.   [facility]: Alcohol abuse treatment with 12-step program  Phone: [phone].  Address: [address], [place]  Website: http://www.price-smith.com/  No fees   [facility]: Outpatient treatment for opiate addiction with medication-assisted treatment and counseling  Phone: [phone].  Address:  [address] [place], [place]  Website: http://www.[email or web address]/[email or web address]-treatment-centers/[email or web address]/  Call to make an appointment.

## 2020-06-03 ENCOUNTER — Encounter: Payer: Self-pay | Admitting: Family Medicine

## 2020-06-03 DIAGNOSIS — Z789 Other specified health status: Secondary | ICD-10-CM | POA: Insufficient documentation

## 2020-06-03 DIAGNOSIS — F1090 Alcohol use, unspecified, uncomplicated: Secondary | ICD-10-CM | POA: Insufficient documentation

## 2020-06-03 LAB — COMPREHENSIVE METABOLIC PANEL
ALT: 22 IU/L (ref 0–44)
AST: 26 IU/L (ref 0–40)
Albumin/Globulin Ratio: 1.8 (ref 1.2–2.2)
Albumin: 4.5 g/dL (ref 3.8–4.8)
Alkaline Phosphatase: 41 IU/L — ABNORMAL LOW (ref 48–121)
BUN/Creatinine Ratio: 11 (ref 10–24)
BUN: 14 mg/dL (ref 8–27)
Bilirubin Total: 0.5 mg/dL (ref 0.0–1.2)
CO2: 21 mmol/L (ref 20–29)
Calcium: 9.9 mg/dL (ref 8.6–10.2)
Chloride: 102 mmol/L (ref 96–106)
Creatinine, Ser: 1.29 mg/dL — ABNORMAL HIGH (ref 0.76–1.27)
GFR calc Af Amer: 65 mL/min/{1.73_m2} (ref >59)
GFR calc non Af Amer: 56 mL/min/{1.73_m2} — ABNORMAL LOW (ref >59)
Globulin, Total: 2.5 g/dL (ref 1.5–4.5)
Glucose: 86 mg/dL (ref 65–99)
Potassium: 4.6 mmol/L (ref 3.5–5.2)
Sodium: 138 mmol/L (ref 134–144)
Total Protein: 7 g/dL (ref 6.0–8.5)

## 2020-06-03 LAB — LIPID PANEL
Chol/HDL Ratio: 5.8 ratio — ABNORMAL HIGH (ref 0.0–5.0)
Cholesterol, Total: 169 mg/dL (ref 100–199)
HDL: 29 mg/dL — ABNORMAL LOW (ref >39)
LDL Chol Calc (NIH): 88 mg/dL (ref 0–99)
Triglycerides: 314 mg/dL — ABNORMAL HIGH (ref 0–149)
VLDL Cholesterol Cal: 52 mg/dL — ABNORMAL HIGH (ref 5–40)

## 2020-06-03 MED ORDER — NALTREXONE HCL 50 MG PO TABS: 50.0000 mg | ORAL_TABLET | Freq: Every day | ORAL | 1 refills | Status: DC

## 2020-06-03 NOTE — Assessment & Plan Note (Signed)
Replaced referral to pain management clinic. Will reach out to referral coordinator regarding Orthopedics referral.

## 2020-06-03 NOTE — Assessment & Plan Note (Signed)
Recheck CMP 

## 2020-06-03 NOTE — Assessment & Plan Note (Signed)
Improved since starting naltrexone. Continue. Also provided local support and counseling resources, although, may be a challenge with language barriar.

## 2020-06-03 NOTE — Assessment & Plan Note (Signed)
Recheck lipid panel. May need a mod/high intensity statin, if not intolerant. Can likely d/c fenofibrate.

## 2020-06-07 ENCOUNTER — Other Ambulatory Visit: Payer: Self-pay | Admitting: Family Medicine

## 2020-06-07 DIAGNOSIS — M5416 Radiculopathy, lumbar region: Secondary | ICD-10-CM

## 2020-06-30 ENCOUNTER — Other Ambulatory Visit: Payer: Self-pay | Admitting: Family Medicine

## 2020-06-30 DIAGNOSIS — M5416 Radiculopathy, lumbar region: Secondary | ICD-10-CM

## 2020-07-18 ENCOUNTER — Ambulatory Visit (INDEPENDENT_AMBULATORY_CARE_PROVIDER_SITE_OTHER): Payer: Medicare Other | Admitting: Family Medicine

## 2020-07-18 ENCOUNTER — Other Ambulatory Visit: Payer: Self-pay

## 2020-07-18 ENCOUNTER — Encounter: Payer: Self-pay | Admitting: Family Medicine

## 2020-07-18 VITALS — BP 130/62 | HR 96 | Ht 66.0 in | Wt 164.4 lb

## 2020-07-18 DIAGNOSIS — Z789 Other specified health status: Secondary | ICD-10-CM

## 2020-07-18 DIAGNOSIS — I10 Essential (primary) hypertension: Secondary | ICD-10-CM | POA: Diagnosis not present

## 2020-07-18 DIAGNOSIS — E785 Hyperlipidemia, unspecified: Secondary | ICD-10-CM | POA: Diagnosis not present

## 2020-07-18 DIAGNOSIS — Z7289 Other problems related to lifestyle: Secondary | ICD-10-CM | POA: Diagnosis not present

## 2020-07-18 DIAGNOSIS — E118 Type 2 diabetes mellitus with unspecified complications: Secondary | ICD-10-CM | POA: Diagnosis not present

## 2020-07-18 MED ORDER — EMPAGLIFLOZIN 25 MG PO TABS: 25.0000 mg | ORAL_TABLET | Freq: Every day | ORAL | 3 refills | Status: AC

## 2020-07-18 MED ORDER — METFORMIN HCL 1000 MG PO TABS: 1000.0000 mg | ORAL_TABLET | Freq: Two times a day (BID) | ORAL | 3 refills | Status: AC

## 2020-07-18 MED ORDER — THIAMINE HCL 100 MG PO TABS: 100.0000 mg | ORAL_TABLET | Freq: Every day | ORAL | 3 refills | Status: AC

## 2020-07-18 MED ORDER — GLIPIZIDE ER 10 MG PO TB24: 10.0000 mg | ORAL_TABLET | Freq: Every day | ORAL | 3 refills | Status: DC

## 2020-07-18 MED ORDER — NALTREXONE HCL 50 MG PO TABS: 100.0000 mg | ORAL_TABLET | Freq: Every day | ORAL | 1 refills | Status: DC

## 2020-07-18 MED ORDER — PRAVASTATIN SODIUM 40 MG PO TABS: 80.0000 mg | ORAL_TABLET | Freq: Every day | ORAL | 1 refills | Status: DC

## 2020-07-18 MED ORDER — LOSARTAN POTASSIUM 50 MG PO TABS: 50.0000 mg | ORAL_TABLET | Freq: Every day | ORAL | 3 refills | Status: AC
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
## 2020-07-18 NOTE — Patient Instructions (Signed)
Thank you for coming to see me today. It was a pleasure.    Please follow-up with PCP in 2 weeks  If you have any questions or concerns, please do not hesitate to call the office at [phone].  Best,   Dana Allan, MD Family Medicine Residency   Some resources that are helpful are listed below.  SAMHSA's National Helpline: 1-800-662-HELP 772 007 0011)  [facility] [address] [place], [place] [address] [phone]  Outpatient Mental Health Providers (No Insurance required or Self Pay)  [facility]  [address] [place], [place] [phone] Crisis [phone]  [facility]) can see uninsured folks for outpatient therapy [email or web address]-[email or web address] [address] [place], [place] [address] [phone]  [facility]    Walk-in Mon-Fri, 8am-3pm [email or web address] [address] [place], [place]  [phone]   [address]  [place] [phone] [facility] for psych med management, there may be a wait- if [facility] is working with clients for OPT, they will coordinate with [facility] for psych  [facility]   Walk-in-Clinic: Monday- Friday 9:00 AM - 4:00 PM [address]   [place], [place] [phone]  [facility] [place] ([employer]) walk in M-F 8am-12pm and  1pm-3pm [place]- [address]     [phone]  [employer] -[address]  Phone: [phone]  [employer] (Mental Health and substance challenges) [address]   [place] [phone]    [email or web address]    [facility]  [place] -[address] [place], [place]     Phone:  [phone] [place]-  [address]  [phone]   [facility]  [address] [place]  [phone] [email or web address]   [facility] ( virtual or zoom therapy) [email or web address]   [address]  [phone]    [facility] [phone]  grief counseling, dementia and caregiver support    [facility] Walk-in MWF 12:30 to 3:00     [address] [place] [address]  [phone]  www.[email or web address] call to schedule an appointment    [facility] Classes ,Support group, Peer support services, [address] [place], [place] [address] [phone]  PhotoSolver.pl           National Alliance on Mental Illness (NAMI) [place]- Wellness classes, Support groups        [address] [place], [place] [address] [phone]   [email or web address]   [facility]  (Psycho-social Rehabilitation clubhouse, Individual and group therapy) [address] [place], [place] [address]   [phone]  24- Hour Availability:  [facility] [phone] or [phone] * [facility] [employer] (Domestic Violence, Rape, etc. )([phone] [employer] (502)495-4202 or 7012521755 * [facility] ([phone] only) [phone] (after hours) *[facility] Mobile Crisis Unit [phone] *[place] National Suicide Hotline (308)469-1860 Len Childs)

## 2020-07-19 ENCOUNTER — Encounter: Payer: Self-pay | Admitting: Family Medicine
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
## 2020-07-19 NOTE — Assessment & Plan Note (Signed)
Normotensive.   Last creatinine 1.29, slight elevation from baseline but not in AKI.  -Refill BP meds -Consider repeat Bmet at next visit -Follow up with PCP

## 2020-07-19 NOTE — Assessment & Plan Note (Signed)
No current complaints.  Last A1c 7.0 -Medications refilled -Consider repeat HbA1c in 3 months -Follow up with PCP

## 2020-07-21 ENCOUNTER — Telehealth: Payer: Self-pay | Admitting: *Deleted

## 2020-07-21 NOTE — Telephone Encounter (Signed)
Pt wants to know if PCP can refill his meloxicam before his appt on the 12th. Jone Baseman, CMA

## 2020-07-21 NOTE — Telephone Encounter (Signed)
Hi, I don't see meloxicam on his med list. If possible I would like to wait until his appointment.  Thanks, Reece Leader

## 2020-07-22 NOTE — Telephone Encounter (Signed)
Yes, I told him that was a possibility  Jone Baseman, CMA

## 2020-07-22 NOTE — Telephone Encounter (Signed)
Thank you :)

## 2020-07-28 ENCOUNTER — Telehealth: Payer: Self-pay | Admitting: *Deleted

## 2020-07-28 DIAGNOSIS — E785 Hyperlipidemia, unspecified: Secondary | ICD-10-CM

## 2020-07-28 NOTE — Telephone Encounter (Signed)
Received message from pharmacy, 2 tablets of Pravachol 40mg  is not covered by insurance.  They are requesting that we change to 80mg  once daily.   To PCP.  , CMA

## 2020-07-29 ENCOUNTER — Other Ambulatory Visit: Payer: Self-pay

## 2020-07-29 MED ORDER — PRAVASTATIN SODIUM 80 MG PO TABS: 80.0000 mg | ORAL_TABLET | Freq: Every day | ORAL | 3 refills | Status: AC

## 2020-07-29 NOTE — Telephone Encounter (Signed)
Good morning, the prescription is changed to pravastatin 80 mg.

## 2020-07-30 NOTE — Telephone Encounter (Signed)
Good morning, since I will be seeing this patient tomorrow and it is not a chronic med, I will wait until his next appointment.

## 2020-07-31 ENCOUNTER — Ambulatory Visit (INDEPENDENT_AMBULATORY_CARE_PROVIDER_SITE_OTHER): Payer: Medicare Other | Admitting: Family Medicine

## 2020-07-31 ENCOUNTER — Other Ambulatory Visit: Payer: Self-pay

## 2020-07-31 ENCOUNTER — Encounter: Payer: Self-pay | Admitting: Family Medicine

## 2020-07-31 VITALS — BP 114/62 | HR 73 | Ht 66.0 in | Wt 165.1 lb

## 2020-07-31 DIAGNOSIS — Z789 Other specified health status: Secondary | ICD-10-CM

## 2020-07-31 DIAGNOSIS — E118 Type 2 diabetes mellitus with unspecified complications: Secondary | ICD-10-CM

## 2020-07-31 DIAGNOSIS — I10 Essential (primary) hypertension: Secondary | ICD-10-CM

## 2020-07-31 DIAGNOSIS — Z7289 Other problems related to lifestyle: Secondary | ICD-10-CM | POA: Diagnosis not present

## 2020-07-31 DIAGNOSIS — F1090 Alcohol use, unspecified, uncomplicated: Secondary | ICD-10-CM

## 2020-07-31 DIAGNOSIS — E785 Hyperlipidemia, unspecified: Secondary | ICD-10-CM

## 2020-07-31 LAB — POCT GLYCOSYLATED HEMOGLOBIN (HGB A1C): HbA1c, POC (controlled diabetic range): 7.1 % — AB (ref 0.0–7.0)
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
## 2020-07-31 NOTE — Assessment & Plan Note (Addendum)
-  BP in the office today 114/62.  -Awaiting BMP, notified patient that will call with abnormal results -Continue losartan -Stressed lifestyle modifications -Encouraged elevation of legs due to occasional LE edema concern voiced by patient

## 2020-07-31 NOTE — Assessment & Plan Note (Signed)
-  A1c 7.1 today, discussed this with patient. -Extensive discussion about lifestyle modifications including incorporating more fruits and plenty of vegetables into diet, eliminating sodas, juices, sweets and fried foods. Encouraged to eat more baked foods. Counseled on eating appropriate portions of carbs as these can increase glycemic index. -Discontinued glipizide, conveyed this to patient and explained the negative interactions with alcohol.

## 2020-07-31 NOTE — Assessment & Plan Note (Signed)
-  Instructed to take thiamine as prescribed -Continue naltrexone as prescribed -Explained to daughter and patient that librium is a medication used short-term for alcohol withdrawal, it is not meant to be for chronic use and also causes drowsiness as evident in patient's prior use. -Recommended [facility], interpretors available. Given the language barrier, therapy and counseling less likely a feasible option. -Referral to social work to discuss further options  -Follow up in 1-2 weeks regarding further discussing options and social work visit, patient may likely benefit from one-on-one motivational interviewing.

## 2020-07-31 NOTE — Assessment & Plan Note (Addendum)
-  Last lipid panel in 05/2020 demonstrated elevated TG 314. -Continue pravastatin and fenofibrate, as prescribed.  -Stressed lifestyle modifications, eliminating fried foods and increased physical activity (walking daily) -Will repeat lipid panel after lifestyle modification changes to reassess since patient may need to be transitioned to a high-intensity statin in the future.

## 2020-07-31 NOTE — Patient Instructions (Addendum)
It was great meeting you both today!  Today we discussed Mark Chase alcohol use, we will continue on the natrexone and thiamine at this time. Please try to avoid drinking alcohol since this causes more violent behaviors.   A1c from today's visit is 7.1. We also extensively discussed making appropriate lifestyle modifications, including walking daily and incorporating fruits and plenty of vegetables into your daily diet. Please limit juices, sodas, fried foods and sweets. Also eat appropriate portions of carbohydrates as we discussed, because these can increase your sugar levels as well. Try to elevate your legs daily to prevent any swelling in your legs and feet.    At this time, we will stop the glipizide that you are taking for your diabetes because this can negatively react with alcohol.   The [facility] is a great outpatient resource that can help provide great support with alcohol use.  You will be getting some blood work today, I will call you with any abnormal results.   Please follow up in about 1 week so that we can further discuss alcohol use and more resources, we will also discuss with the social worker at this time.   Please don't hesitate to contact us sooner if any questions or concerns arise. Thank you for allowing me to be a part of your medical care!

## 2020-08-01 ENCOUNTER — Encounter: Payer: Self-pay | Admitting: Family Medicine

## 2020-08-01 LAB — BASIC METABOLIC PANEL
BUN/Creatinine Ratio: 8 — ABNORMAL LOW (ref 10–24)
BUN: 10 mg/dL (ref 8–27)
CO2: 22 mmol/L (ref 20–29)
Calcium: 9.9 mg/dL (ref 8.6–10.2)
Chloride: 102 mmol/L (ref 96–106)
Creatinine, Ser: 1.26 mg/dL (ref 0.76–1.27)
GFR calc Af Amer: 67 mL/min/{1.73_m2} (ref >59)
GFR calc non Af Amer: 58 mL/min/{1.73_m2} — ABNORMAL LOW (ref >59)
Glucose: 83 mg/dL (ref 65–99)
Potassium: 4.2 mmol/L (ref 3.5–5.2)
Sodium: 139 mmol/L (ref 134–144)

## 2020-08-04 NOTE — Progress Notes (Signed)
Letter mailed. Natasha Burda,CMA  

## 2020-08-07 ENCOUNTER — Telehealth: Payer: Self-pay | Admitting: *Deleted

## 2020-08-07 NOTE — Chronic Care Management (AMB) (Signed)
  Care Management   Note  08/07/2020 Name: Muneer Leider MRN: [id number] DOB: 09/28/1951  Sung Parodi is a 69 y.o. year old male who is a primary care patient of Ganta, Anupa, DO. I reached out to Judd Lien by phone today in response to a referral sent by Mr. Woodard Sano's health plan.    Mr. Alberto was given information about care management services today including:  1. Care management services include personalized support from designated clinical staff supervised by his physician, including individualized plan of care and coordination with other care providers 2. 24/7 contact phone numbers for assistance for urgent and routine care needs. 3. The patient may stop care management services at any time by phone call to the office staff.  Patient agreed to services and verbal consent obtained.   Follow up plan: Telephone appointment with care management team member scheduled for: 08/14/2020  [facility] Guide, Embedded Care Coordination [facility]  [place], [place] [address] Direct Dial: [phone] [email or web address] Website: [email or web address]

## 2020-08-14 ENCOUNTER — Ambulatory Visit: Payer: Self-pay | Admitting: Licensed Clinical Social Worker

## 2020-08-14 ENCOUNTER — Ambulatory Visit: Payer: Medicare Other

## 2020-08-14 NOTE — Chronic Care Management (AMB) (Signed)
   Social Work  Care Management Collaboration 08/14/2020 Name: Mark Chase MRN: [id number] DOB: 1951-02-07 Mark Chase is a 69 y.o. year old male who sees [place], Anupa, DO for primary care. LCSW was consulted by PCP to assistance patient with  Alcohol use counseling and resources.  CCM RN care manager had initial phone visit with patient, patient denies wanting counseling or assistance with alcohol.  Reports he has not had a drink in 25 days.   Intervention: Patient was not interviewed or contacted during this encounter.  LCSW collaborated with CCM RN for assessment of needs. Plan:  1. No further follow up required by LCSW at this time  1. RN care manager will follow up with the patient for ongoing needs.    2. If further intervention or needs are Identified  RN care manager will contact LCSW to provide interventions.    Review of patient status, including review of consultants reports, relevant laboratory and other test results, and collaboration with appropriate care team members and the patient's provider was performed as part of comprehensive patient evaluation and provision of chronic care management services.    Sammuel Hines, LCSW Care Management & Coordination  [facility] / [facility]   [phone] 11:04 AM

## 2020-08-14 NOTE — Patient Instructions (Signed)
Visit Information  Goals Addressed              This Visit's Progress     I am checking my blood sugars bid (pt-stated)        CARE PLAN ENTRY (see longtitudinal plan of care for additional care plan information)  Objective:  Lab Results  Component Value Date   HGBA1C 7.1 (A) 07/31/2020    Lab Results  Component Value Date   CREATININE 1.26 07/31/2020   CREATININE 1.29 (H) 06/02/2020   CREATININE 1.21 05/02/2020    Current Barriers:   Knowledge Deficits related to basic Diabetes pathophysiology and self care/management= RNCM was referred by Dr Robyne Peers to the patient for DM II and Medication assistance.  After speaking with the patient he stated that he has medicaid ans medicare and he get his medications at no cost.  He also informed me that he has an endocrinologist that [facility]   Case Manager Clinical Goal(s):  Over the next 90 days, patient will demonstrate improved adherence to prescribed treatment plan for diabetes self care/management as evidenced by: lowering his a1c by 1-2 points  Interventions:   Provided education to patient about basic DM disease process  Reviewed medications with patient and discussed importance of medication adherence  Discussed plans with patient for ongoing care management follow up and provided patient with direct contact information for care management team  Reviewed scheduled/upcoming provider appointments including: Dr Robyne Peers 08/22/20 and Endocinnologist on 09/23/20  Review of patient status, including review of consultants reports, relevant laboratory and other test results, and medications completed.  Discussed checking his blood sugars and he checks BID. His blood sugar this morning was 120.  He states that his diet consist of vegetables and  roasted tortillas. He and his wife go grocery shopping twice a week.  He states that he does not do a lot of walking because he has back problems.  He has 2 injections in his bakk and  is cheduled to have a 3rd.  If this one does not work he will have surgery.  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  He stated no.  He said that the physician said he needed to stop and he haas not drank anything in 20-25 days.  The patient gave me his permission to call him back at anytime to discuss his diabetes  Patient Self Care Activities:   Self administers oral medications as prescribed  Attends all scheduled provider appointments  Initial goal documentation        Mark Chase was given information about Care Management services today including:  1. Care Management services include personalized support from designated clinical staff supervised by his physician, including individualized plan of care and coordination with other care providers 2. 24/7 contact phone numbers for assistance for urgent and routine care needs. 3. The patient may stop CCM services at any time (effective at the end of the month) by phone call to the office staff.  Patient agreed to services and verbal consent obtained.   The patient verbalized understanding of instructions provided today and declined a print copy of patient instruction materials.   The care management team will reach out to the patient again over the next 14 days.   [name] RN, BSN, [facility] Care Management Coordinator [facility]  Phone: [phone] Fax: [phone]

## 2020-08-14 NOTE — Chronic Care Management (AMB) (Signed)
Care Management   Initial Visit Note  08/14/2020 Name: Mark Chase MRN: [id number] DOB: 17-Apr-1951   Assessment: Mark Chase is a 69 y.o. year old male who sees [place], Anupa, DO for primary care. The care management team was consulted for assistance with care management and care coordination needs related to Disease Management Educational Needs diabetes chronic low back pain .   Review of patient status, including review of consultants reports, relevant laboratory and other test results, and collaboration with appropriate care team members and the patient's provider was performed as part of comprehensive patient evaluation and provision of care management services.    SDOH (Social Determinants of Health) assessments performed: No See Care Plan activities for detailed interventions related to [facility])     Outpatient Encounter Medications as of 08/14/2020  Medication Sig  . aspirin 81 MG tablet Take 81 mg by mouth daily.  . DULoxetine (CYMBALTA) 20 MG capsule Take 1 capsule by mouth once daily  . empagliflozin (JARDIANCE) 25 MG TABS tablet Take 1 tablet (25 mg total) by mouth daily.  . fenofibrate (TRICOR) 145 MG tablet Take 1 tablet (145 mg total) by mouth daily.  Marland Kitchen losartan (COZAAR) 50 MG tablet Take 1 tablet (50 mg total) by mouth daily.  . metFORMIN (GLUCOPHAGE) 1000 MG tablet Take 1 tablet (1,000 mg total) by mouth 2 (two) times daily with a meal.  . naltrexone (DEPADE) 50 MG tablet Take 2 tablets (100 mg total) by mouth daily.  . pravastatin (PRAVACHOL) 80 MG tablet Take 1 tablet (80 mg total) by mouth at bedtime.  . traZODone (DESYREL) 50 MG tablet Take 1 tablet (50 mg total) by mouth at bedtime.  . chlordiazePOXIDE (LIBRIUM) 25 MG capsule 50mg  PO TID x 1D, then 25-50mg  PO BID X 1D, then 25-50mg  PO QD X 1D (Patient not taking: Reported on 07/31/2020)  . diclofenac Sodium (VOLTAREN) 1 % GEL APPLY  4 GRAMS OF GEL TOPICALLY TO AFFECTED AREA 4 TIMES DAILY (Patient not taking: Reported on  07/31/2020)  . gabapentin (NEURONTIN) 300 MG capsule Take 1 capsule (300 mg total) by mouth 2 (two) times daily.  09/30/2020 thiamine 100 MG tablet Take 1 tablet (100 mg total) by mouth daily. (Patient not taking: Reported on 07/31/2020)   No facility-administered encounter medications on file as of 08/14/2020.    Goals Addressed              This Visit's Progress   .  I am checking my blood sugars bid (pt-stated)        CARE PLAN ENTRY (see longtitudinal plan of care for additional care plan information)  Objective:  Lab Results  Component Value Date   HGBA1C 7.1 (A) 07/31/2020 .   Lab Results  Component Value Date   CREATININE 1.26 07/31/2020   CREATININE 1.29 (H) 06/02/2020   CREATININE 1.21 05/02/2020    Current Barriers:  05/04/2020 Knowledge Deficits related to basic Diabetes pathophysiology and self care/management= RNCM was referred by Dr Marland Kitchen to the patient for DM II and Medication assistance.  After speaking with the patient he stated that he has medicaid ans medicare and he get his medications at no cost.  He also informed me that he has an endocrinologist that he see at [facility]   Case Manager Clinical Goal(s):  Over the next 90 days, patient will demonstrate improved adherence to prescribed treatment plan for diabetes self care/management as evidenced by: lowering his a1c by 1-2 points  Interventions:  . Provided education to patient  about basic DM disease process . Reviewed medications with patient and discussed importance of medication adherence . Discussed plans with patient for ongoing care management follow up and provided patient with direct contact information for care management team . Reviewed scheduled/upcoming provider appointments including: Dr Robyne Peers 08/22/20 and Endocinnologist on 09/23/20 . Review of patient status, including review of consultants reports, relevant laboratory and other test results, and medications completed. . Discussed checking his blood sugars and  he checks BID. His blood sugar this morning was 120.  He states that his diet consist of vegetables and  roasted tortillas. He and his wife go grocery shopping twice a week. Marland Kitchen He states that he does not do a lot of walking because he has back problems.  He has 2 injections in his bakk and is cheduled to have a 3rd.  If this one does not work he will have surgery. 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 RNCM asked the patient at the end of the conversation would he like counseling for his drinking.  He stated no.  He said that the physician said he needed to stop and he haas not drank anything in 20-25 days. . The patient gave me his permission to call him back at anytime to discuss his diabetes  Patient Self Care Activities:  . Self administers oral medications as prescribed . Attends all scheduled provider appointments  Initial goal documentation         Follow up plan:  The care management team will reach out to the patient again over the next 14 days.   Mark Chase was given information about Care Management services today including:  1. Care Management services include personalized support from designated clinical staff supervised by a physician, including individualized plan of care and coordination with other care providers 2. 24/7 contact phone numbers for assistance for urgent and routine care needs. 3. The patient may stop Care Management services at any time (effective at the end of the month) by phone call to the office staff.  Patient agreed to services and verbal consent obtained.  [name] RN, BSN, [facility] Care Management Coordinator [facility] Phone: [phone] Fax: [phone]

## 2020-08-15 ENCOUNTER — Other Ambulatory Visit: Payer: Self-pay

## 2020-08-22 ENCOUNTER — Ambulatory Visit: Payer: Medicare Other | Admitting: Family Medicine

## 2020-08-29 ENCOUNTER — Ambulatory Visit: Payer: Medicare Other

## 2020-08-29 NOTE — Patient Instructions (Signed)
Visit Information  Goals Addressed              This Visit's Progress     I am checking my blood sugars bid (pt-stated)        CARE PLAN ENTRY (see longtitudinal plan of care for additional care plan information)  Objective:  Lab Results  Component Value Date   HGBA1C 7.1 (A) 07/31/2020    Lab Results  Component Value Date   CREATININE 1.26 07/31/2020   CREATININE 1.29 (H) 06/02/2020   CREATININE 1.21 05/02/2020    Current Barriers:   Knowledge Deficits related to basic Diabetes pathophysiology and self care/management= RNCM was referred by Dr Robyne Peers to the patient for DM II and Medication assistance.  After speaking with the patient he stated that he has medicaid ans medicare and he get his medications at no cost.  He also informed me that he has an endocrinologist that he see at [facility]   Case Manager Clinical Goal(s):  Over the next 90 days, patient will demonstrate improved adherence to prescribed treatment plan for diabetes self care/management as evidenced by: lowering his a1c by 1-2 points  Interventions:   Provided education to patient about basic DM disease process  Reviewed medications with patient and discussed importance of medication adherence  Discussed plans with patient for ongoing care management follow up and provided patient with direct contact information for care management team  Reviewed scheduled/upcoming provider appointments including: Dr Robyne Peers 08/22/20 and Endocinnologist on 09/23/20  Review of patient status, including review of consultants reports, relevant laboratory and other test results, and medications completed.  Spoke with the patient and he states that his blood sugar this mornign was 132.  He continues to monitor his diet .  He said that he drink a gallon of water a day.  He states that his glucometer is not working correctly .  RNCM made an appointment for him to come into the office on Monday 09-01-20 and bring his glucometer for me  to help.  The patient states that he had his 3rd injection in his back 10-14 days ago and at first it help ed but now it is hurting.  He is using cold packs, ointment and takes pain medication.  He said he is scheduled to have an MRI and  an appointment with the doctor they will let him know then if he will need the surgery or not.  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  He stated no.  He said that the physician said he needed to stop and he haas not drank anything in 20-25 days.  The patient gave me his permission to call him back at anytime to discuss his diabetes  Patient Self Care Activities:   Self administers oral medications as prescribed  Attends all scheduled provider appointments  Please see past updates related to this goal by clicking on the "Past Updates" button in the selected goal         [name] was given information about Care Management services today including:  1. Care Management services include personalized support from designated clinical staff supervised by his physician, including individualized plan of care and coordination with other care providers 2. 24/7 contact phone numbers for assistance for urgent and routine care needs. 3. The patient may stop CCM services at any time (effective at  the end of the month) by phone call to the office staff.  Patient agreed to services and verbal consent obtained.   The patient verbalized understanding of instructions provided today and declined a print copy of patient instruction materials.   The care management team will reach out to the patient again over the next 14 days.   [name] RN, BSN, [facility] Care Management Coordinator [facility] Phone: [phone] Fax: [phone]

## 2020-08-29 NOTE — Chronic Care Management (AMB) (Signed)
Care Management   Follow Up Note   08/29/2020 Name: Mark Chase MRN: [id number] DOB: [birth year]  Referred by: Reece Leader, DO Reason for referral : Appointment (DM II+-)   Mark Chase is a 69 y.o. year old male who is a primary care patient of Ganta, Anupa, DO. The care management team was consulted for assistance with care management and care coordination needs.    Review of patient status, including review of consultants reports, relevant laboratory and other test results, and collaboration with appropriate care team members and the patient's provider was performed as part of comprehensive patient evaluation and provision of chronic care management services.    SDOH (Social Determinants of Health) assessments performed: No See Care Plan activities for detailed interventions related to [facility])     Advanced Directives: See Care Plan and Vynca application for related entries.   Goals Addressed              This Visit's Progress   .  I am checking my blood sugars bid (pt-stated)        CARE PLAN ENTRY (see longtitudinal plan of care for additional care plan information)  Objective:  Lab Results  Component Value Date   HGBA1C 7.1 (A) 07/31/2020 .   Lab Results  Component Value Date   CREATININE 1.26 07/31/2020   CREATININE 1.29 (H) 06/02/2020   CREATININE 1.21 05/02/2020    Current Barriers:  Marland Kitchen Knowledge Deficits related to basic Diabetes pathophysiology and self care/management= RNCM was referred by Dr Robyne Peers to the patient for DM II and Medication assistance.  After speaking with the patient he stated that he has medicaid ans medicare and he get his medications at no cost.  He also informed me that he has an endocrinologist that he see at [facility]   Case Manager Clinical Goal(s):  Over the next 90 days, patient will demonstrate improved adherence to prescribed treatment plan for diabetes self care/management as evidenced by: lowering his a1c by 1-2  points  Interventions:  . Provided education to patient about basic DM disease process . Reviewed medications with patient and discussed importance of medication adherence . Discussed plans with patient for ongoing care management follow up and provided patient with direct contact information for care management team . Reviewed scheduled/upcoming provider appointments including: Dr Robyne Peers 08/22/20 and Endocinnologist on 09/23/20 . Review of patient status, including review of consultants reports, relevant laboratory and other test results, and medications completed. Marland Kitchen Spoke with the patient and he states that his blood sugar this mornign was 132.  He continues to monitor his diet .  He said that he drink a gallon of water a day.  He states that his glucometer is not working correctly .  RNCM made an appointment for him to come into the office on Monday 09-01-20 and bring his glucometer for me to help. . The patient states that he had his 3rd injection in his back 10-14 days ago and at first it help ed but now it is hurting.  He is using cold packs, ointment and takes pain medication. Marland Kitchen He said he is scheduled to have an MRI and  an appointment with the doctor they will let him know then if he will need the surgery or not. 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 RNCM asked the patient at the end of the conversation would he like counseling for his drinking.  He stated no.  He  said that the physician said he needed to stop and he haas not drank anything in 20-25 days. . The patient gave me his permission to call him back at anytime to discuss his diabetes  Patient Self Care Activities:  . Self administers oral medications as prescribed . Attends all scheduled provider appointments  Please see past updates related to this goal by clicking on the "Past Updates" button in the selected goal          The care management team will reach out to the patient again over the  next 14 days.   [name] RN, BSN, [facility] Care Management Coordinator [facility] Phone: [phone] Fax: [phone]

## 2020-09-01 ENCOUNTER — Ambulatory Visit: Payer: Medicare Other

## 2020-09-01 ENCOUNTER — Telehealth: Payer: Self-pay

## 2020-09-01 ENCOUNTER — Other Ambulatory Visit: Payer: Self-pay

## 2020-09-01 DIAGNOSIS — M5416 Radiculopathy, lumbar region: Secondary | ICD-10-CM | POA: Diagnosis not present

## 2020-09-01 NOTE — Telephone Encounter (Signed)
°  Care Management   Follow Up Note   09/01/2020 Name: Heath Tesler MRN: [id number] DOB: 01/06/1951  Referred by: Reece Leader, DO Reason for referral : Appointment (Glucometer)   Joedy Eickhoff is a 69 y.o. year old male who is a primary care patient of Ganta, Anupa, DO. The care management team was consulted for assistance with care management and care coordination needs.    Review of patient status, including review of consultants reports, relevant laboratory and other test results, and collaboration with appropriate care team members and the patient's provider was performed as part of comprehensive patient evaluation and provision of chronic care management services.    Patient was scheduled for face to face visit in the office for help with his glucometer.  Called the patient and he thought that he was suppose to come at 130.  He has a ortho appointment across the street that he needs to go to and wants to come at that time.  RNCM will see the patient at the new scheduled time.  [name] RN, BSN, [facility] Care Management Coordinator [facility] Phone: [phone] Fax: [phone]  .

## 2020-09-01 NOTE — Chronic Care Management (AMB) (Signed)
Care Management   Follow Up Note   09/01/2020 Name: Mark Chase MRN: [id number] DOB: 19-Dec-1951  Referred by: Reece Leader, DO Reason for referral : Chronic Care Management   Mark Chase is a 69 y.o. year old male who is a primary care patient of Ganta, Anupa, DO. The care management team was consulted for assistance with care management and care coordination needs.    Review of patient status, including review of consultants reports, relevant laboratory and other test results, and collaboration with appropriate care team members and the patient's provider was performed as part of comprehensive patient evaluation and provision of chronic care management services.    SDOH (Social Determinants of Health) assessments performed: No See Care Plan activities for detailed interventions related to [facility])     Advanced Directives: See Care Plan and Vynca application for related entries.   Goals Addressed              This Visit's Progress   .  I am checking my blood sugars bid (pt-stated)        CARE PLAN ENTRY (see longtitudinal plan of care for additional care plan information)  Objective:  Lab Results  Component Value Date   HGBA1C 7.1 (A) 07/31/2020 .   Lab Results  Component Value Date   CREATININE 1.26 07/31/2020   CREATININE 1.29 (H) 06/02/2020   CREATININE 1.21 05/02/2020    Current Barriers:  Marland Kitchen Knowledge Deficits related to basic Diabetes pathophysiology and self care/management= RNCM was referred by Dr Robyne Peers to the patient for DM II and Medication assistance.  After speaking with the patient he stated that he has medicaid ans medicare and he get his medications at no cost.  He also informed me that he has an endocrinologist that he see at [facility]   Case Manager Clinical Goal(s):  Over the next 90 days, patient will demonstrate improved adherence to prescribed treatment plan for diabetes self care/management as evidenced by: lowering his a1c by 1-2  points  Interventions:  . Provided education to patient about basic DM disease process . Reviewed medications with patient and discussed importance of medication adherence . Discussed plans with patient for ongoing care management follow up and provided patient with direct contact information for care management team . Reviewed scheduled/upcoming provider appointments including: Dr Robyne Peers 08/22/20 and Endocinnologist on 09/23/20 . Review of patient status, including review of consultants reports, relevant laboratory and other test results, and medications completed. . Patient came into the office today for me to see him to help with his glucometer.  He stated that he had changed the batteries.  He still had the glucometer in the mode were he was putting in the date and time so his reading would not register and it was coming up an error. . I should him how I changed it .  We stuck hi finger and checked his blood sugar.  It was 389.  With the interpreter I tod him it was very high.  He said he ate around 11 am. . I encouraged him to watch what he ate.  He could not stay long due to he has an appointment with ortho across the street.Marland Kitchen  He verbalized understanding of how to check his blood sugar with the machine and went through the steps with me and verbally said what he should see on the monitor. 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 RNCM asked the patient at the  end of the conversation would he like counseling for his drinking.  He stated no.  He said that the physician said he needed to stop and he haas not drank anything in 20-25 days. . The patient gave me his permission to call him back at anytime to discuss his diabetes  Patient Self Care Activities:  . Self administers oral medications as prescribed . Attends all scheduled provider appointments  Please see past updates related to this goal by clicking on the "Past Updates" button in the selected goal           RNCM will follow up with the patient within the next month of  September  Dior Dominik RN, BSN, [facility] Care Management Coordinator [facility] Phone: [phone] Fax: [phone]

## 2020-09-01 NOTE — Patient Instructions (Signed)
Visit Information  Goals Addressed              This Visit's Progress   .  I am checking my blood sugars bid (pt-stated)        CARE PLAN ENTRY (see longtitudinal plan of care for additional care plan information)  Objective:  Lab Results  Component Value Date   HGBA1C 7.1 (A) 07/31/2020 .   Lab Results  Component Value Date   CREATININE 1.26 07/31/2020   CREATININE 1.29 (H) 06/02/2020   CREATININE 1.21 05/02/2020    Current Barriers:  Marland Kitchen Knowledge Deficits related to basic Diabetes pathophysiology and self care/management= RNCM was referred by Dr Robyne Peers to the patient for DM II and Medication assistance.  After speaking with the patient he stated that he has medicaid ans medicare and he get his medications at no cost.  He also informed me that he has an endocrinologist that he see at [facility]   Case Manager Clinical Goal(s):  Over the next 90 days, patient will demonstrate improved adherence to prescribed treatment plan for diabetes self care/management as evidenced by: lowering his a1c by 1-2 points  Interventions:  . Provided education to patient about basic DM disease process . Reviewed medications with patient and discussed importance of medication adherence . Discussed plans with patient for ongoing care management follow up and provided patient with direct contact information for care management team . Reviewed scheduled/upcoming provider appointments including: Dr Robyne Peers 08/22/20 and Endocinnologist on 09/23/20 . Review of patient status, including review of consultants reports, relevant laboratory and other test results, and medications completed. . Patient came into the office today for me to see him to help with his glucometer.  He stated that he had changed the batteries.  He still had the glucometer in the mode were he was putting in the date and time so his reading would not register and it was coming up an error. . I should him how I changed it .  We stuck hi finger  and checked his blood sugar.  It was 389.  With the interpreter I tod him it was very high.  He said he ate around 11 am. . I encouraged him to watch what he ate.  He could not stay long due to he has an appointment with ortho across the street.Marland Kitchen  He verbalized understanding of how to check his blood sugar with the machine and went through the steps with me and verbally said what he should see on the monitor. 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 RNCM asked the patient at the end of the conversation would he like counseling for his drinking.  He stated no.  He said that the physician said he needed to stop and he haas not drank anything in 20-25 days. . The patient gave me his permission to call him back at anytime to discuss his diabetes  Patient Self Care Activities:  . Self administers oral medications as prescribed . Attends all scheduled provider appointments  Please see past updates related to this goal by clicking on the "Past Updates" button in the selected goal         Mr. Moncada was given information about Care Management services today including:  1. Care Management services include personalized support from designated clinical staff supervised by his physician, including individualized plan of care and coordination with other care providers 2. 24/7 contact phone numbers for assistance for  urgent and routine care needs. 3. The patient may stop CCM services at any time (effective at the end of the month) by phone call to the office staff.  Patient agreed to services and verbal consent obtained.   The patient verbalized understanding of instructions provided today and declined a print copy of patient instruction materials.   RNCM will follow up with the patient within the next month of  September  Matt Delpizzo RN, BSN, [facility] Care Management Coordinator [facility] Phone: [phone] Fax: [phone]

## 2020-09-08 ENCOUNTER — Other Ambulatory Visit: Payer: Self-pay | Admitting: Family Medicine

## 2020-09-08 ENCOUNTER — Other Ambulatory Visit: Payer: Self-pay

## 2020-09-08 ENCOUNTER — Telehealth: Payer: Self-pay

## 2020-09-08 DIAGNOSIS — M5416 Radiculopathy, lumbar region: Secondary | ICD-10-CM

## 2020-09-08 DIAGNOSIS — M25512 Pain in left shoulder: Secondary | ICD-10-CM

## 2020-09-08 NOTE — Telephone Encounter (Signed)
Patient calls nurse line requesting referral to Endocrinologist in [place]. Patient is requesting Dr. Lucianne Muss at [facility].   To PCP  Veronda Prude, RN

## 2020-09-09 ENCOUNTER — Other Ambulatory Visit: Payer: Self-pay | Admitting: Family Medicine

## 2020-09-09 DIAGNOSIS — E118 Type 2 diabetes mellitus with unspecified complications: Secondary | ICD-10-CM

## 2020-09-09 NOTE — Telephone Encounter (Signed)
Referral placed, I have put in that patient requests Dr. Lucianne Muss due to language preferences.

## 2020-09-16 ENCOUNTER — Ambulatory Visit: Payer: Medicare Other

## 2020-09-16 NOTE — Patient Instructions (Signed)
Visit Information  Goals Addressed              This Visit's Progress   .  I am checking my blood sugars bid (pt-stated)        CARE PLAN ENTRY (see longtitudinal plan of care for additional care plan information)  Objective:  Lab Results  Component Value Date   HGBA1C 7.1 (A) 07/31/2020 .   Lab Results  Component Value Date   CREATININE 1.26 07/31/2020   CREATININE 1.29 (H) 06/02/2020   CREATININE 1.21 05/02/2020    Current Barriers:  Marland Kitchen Knowledge Deficits related to basic Diabetes pathophysiology and self care/management= RNCM was referred by Dr Robyne Peers to the patient for DM II and Medication assistance.  After speaking with the patient he stated that he has medicaid ans medicare and he get his medications at no cost.  He also informed me that he has an endocrinologist that he see at [facility]   Case Manager Clinical Goal(s):  Over the next 90 days, patient will demonstrate improved adherence to prescribed treatment plan for diabetes self care/management as evidenced by: lowering his a1c by 1-2 points  Interventions:  . Provided education to patient about basic DM disease process . Reviewed medications with patient and discussed importance of medication adherence . Discussed plans with patient for ongoing care management follow up and provided patient with direct contact information for care management team . Reviewed scheduled/upcoming provider appointments including: Dr Robyne Peers 08/22/20 and Endocinnologist on 09/23/20 . Review of patient status, including review of consultants reports, relevant laboratory and other test results, and medications completed. Marland Kitchen Spoke with the patient today with the help of interpreter [phone].  He states that his blood sugars have been ranging 120-140. If he goes out to dine he said he may miss a dose and his blood sugar will go up to 200. . This morning he was at [facility] and had not checked his blood sugar today. . Advised him to take meds as  prescribed . Asked him about his glucometer he states he is still having problems with it.  He states that he has to put the blood on the side of the strip and it is hard for him because he has a shake. I asked would it be possible for his wife to assist him when he checks his blood sugars.  He states that is not an option all the time due to she has acatering business and they have different schedules. . He states that he has an appointment tomorrow and he wants a new glucometer.  He plans on asking his PCP when he comes in. 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 RNCM asked the patient at the end of the conversation would he like counseling for his drinking.  He stated no.  He said that the physician said he needed to stop and he haas not drank anything in 20-25 days. . The patient gave me his permission to call him back at anytime to discuss his diabetes  Patient Self Care Activities:  . Self administers oral medications as prescribed . Attends all scheduled provider appointments  Please see past updates related to this goal by clicking on the "Past Updates" button in the selected goal         Mr. Taul was given information about Care Management services today including:  1. Care Management services include personalized support from designated clinical staff supervised by his  physician, including individualized plan of care and coordination with other care providers 2. 24/7 contact phone numbers for assistance for urgent and routine care needs. 3. The patient may stop CCM services at any time (effective at the end of the month) by phone call to the office staff.  Patient agreed to services and verbal consent obtained.   The patient verbalized understanding of instructions provided today and declined a print copy of patient instruction materials.   The care management team will reach out to the patient again over the next 14 days.   [name] RN,  BSN, [facility] Care Management Coordinator [facility] Phone: [phone] Fax: [phone]

## 2020-09-16 NOTE — Chronic Care Management (AMB) (Signed)
Care Management   Follow Up Note   09/16/2020 Name: Mark Chase MRN: [id number] DOB: 11/06/1951  Referred by: Reece Leader, DO Reason for referral : Chronic Care Management (DM II)   Mark Chase is a 69 y.o. year old male who is a primary care patient of Ganta, Anupa, DO. The care management team was consulted for assistance with care management and care coordination needs.    Review of patient status, including review of consultants reports, relevant laboratory and other test results, and collaboration with appropriate care team members and the patient's provider was performed as part of comprehensive patient evaluation and provision of chronic care management services.    SDOH (Social Determinants of Health) assessments performed: No See Care Plan activities for detailed interventions related to [facility])     Advanced Directives: See Care Plan and Vynca application for related entries.   Goals Addressed              This Visit's Progress     I am checking my blood sugars bid (pt-stated)        CARE PLAN ENTRY (see longtitudinal plan of care for additional care plan information)  Objective:  Lab Results  Component Value Date   HGBA1C 7.1 (A) 07/31/2020    Lab Results  Component Value Date   CREATININE 1.26 07/31/2020   CREATININE 1.29 (H) 06/02/2020   CREATININE 1.21 05/02/2020    Current Barriers:   Knowledge Deficits related to basic Diabetes pathophysiology and self care/management= RNCM was referred by Dr Robyne Peers to the patient for DM II and Medication assistance.  After speaking with the patient he stated that he has medicaid ans medicare and he get his medications at no cost.  He also informed me that he has an endocrinologist that he see at [facility]   Case Manager Clinical Goal(s):  Over the next 90 days, patient will demonstrate improved adherence to prescribed treatment plan for diabetes self care/management as evidenced by: lowering his a1c by 1-2  points  Interventions:   Provided education to patient about basic DM disease process  Reviewed medications with patient and discussed importance of medication adherence  Discussed plans with patient for ongoing care management follow up and provided patient with direct contact information for care management team  Reviewed scheduled/upcoming provider appointments including: Dr Robyne Peers 08/22/20 and Endocinnologist on 09/23/20  Review of patient status, including review of consultants reports, relevant laboratory and other test results, and medications completed.  Spoke with the patient today with the help of interpreter [phone].  He states that his blood sugars have been ranging 120-140. If he goes out to dine he said he may miss a dose and his blood sugar will go up to 200.  This morning he was at [facility] and had not checked his blood sugar today.  Advised him to take meds as prescribed  Asked him about his glucometer he states he is still having problems with it.  He states that he has to put the blood on the side of the strip and it is hard for him because he has a shake. I asked would it be possible for his wife to assist him when he checks his blood sugars.  He states that is not an option all the time due to she has acatering business and they have different schedules.  He states that he has an appointment tomorrow and he wants a new glucometer.  He plans on asking his PCP when he comes in.  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  He stated no.  He said that the physician said he needed to stop and he haas not drank anything in 20-25 days.  The patient gave me his permission to call him back at anytime to discuss his diabetes  Patient Self Care Activities:   Self administers oral medications as prescribed  Attends all scheduled provider  appointments  Please see past updates related to this goal by clicking on the "Past Updates" button in the selected goal          The care management team will reach out to the patient again over the next 14 days.   [name] RN, BSN, [facility] Care Management Coordinator [facility] Phone: [phone] Fax: [phone]

## 2020-09-17 ENCOUNTER — Encounter: Payer: Self-pay | Admitting: Family Medicine

## 2020-09-17 ENCOUNTER — Ambulatory Visit (INDEPENDENT_AMBULATORY_CARE_PROVIDER_SITE_OTHER): Payer: Medicare Other | Admitting: Family Medicine

## 2020-09-17 ENCOUNTER — Other Ambulatory Visit: Payer: Self-pay

## 2020-09-17 VITALS — BP 112/60 | HR 74 | Ht 66.0 in | Wt 162.0 lb

## 2020-09-17 DIAGNOSIS — E118 Type 2 diabetes mellitus with unspecified complications: Secondary | ICD-10-CM | POA: Diagnosis not present

## 2020-09-17 DIAGNOSIS — F1010 Alcohol abuse, uncomplicated: Secondary | ICD-10-CM

## 2020-09-17 DIAGNOSIS — Z7289 Other problems related to lifestyle: Secondary | ICD-10-CM

## 2020-09-17 DIAGNOSIS — G6281 Critical illness polyneuropathy: Secondary | ICD-10-CM

## 2020-09-17 DIAGNOSIS — M5416 Radiculopathy, lumbar region: Secondary | ICD-10-CM | POA: Diagnosis not present

## 2020-09-17 DIAGNOSIS — Z23 Encounter for immunization: Secondary | ICD-10-CM

## 2020-09-17 DIAGNOSIS — Z0000 Encounter for general adult medical examination without abnormal findings: Secondary | ICD-10-CM | POA: Diagnosis not present

## 2020-09-17 DIAGNOSIS — I10 Essential (primary) hypertension: Secondary | ICD-10-CM

## 2020-09-17 DIAGNOSIS — Z789 Other specified health status: Secondary | ICD-10-CM

## 2020-09-17 MED ORDER — SHINGRIX 50 MCG/0.5ML IM SUSR: 0.5000 mL | Freq: Once | INTRAMUSCULAR | 0 refills | Status: AC

## 2020-09-17 MED ORDER — NALTREXONE HCL 50 MG PO TABS: 100.0000 mg | ORAL_TABLET | Freq: Every day | ORAL | 1 refills | Status: AC

## 2020-09-17 MED ORDER — GABAPENTIN 300 MG PO CAPS: 300.0000 mg | ORAL_CAPSULE | Freq: Once | ORAL | 1 refills | Status: DC

## 2020-09-17 MED ORDER — GLUCOSE BLOOD VI STRP: ORAL_STRIP | 1 refills | Status: DC

## 2020-09-17 NOTE — Assessment & Plan Note (Signed)
-  instructed to take duloxetine 20 mg bid for pain control -next orthopedics appointment is 10/4 -tiredness that patient has been experiencing likely due to gabapentin. Decreased dose to gabapentin 300 mg once a day. -instructed to walk often as form of exercise to remain active while not further exacerbating pain

## 2020-09-17 NOTE — Assessment & Plan Note (Addendum)
-  continue jardiance and metformin as prescribed -last ophthalmology exam was 6 months ago, instructed to schedule another one soon -encouraged to eat plenty of vegetables and low glycemic index foods, cutting down on high sugar containing foods -recheck A1c and BMP at next visit  -continue to follow with chronic care management

## 2020-09-17 NOTE — Progress Notes (Addendum)
SUBJECTIVE:   CHIEF COMPLAINT / HPI:   Mr. Friedland is a 69 yr old male with an extensive past medical history including hypertension, alcohol abuse, type 2 DM and hyperlipidemia. Patient presents for a follow up visit, states that he feels very tired lately.   PERTINENT  PMH / PSH:   Alcohol use Patient compliant on naltrexone. Daughter states that sometimes patient still gets aggressive and has days where he is in a better mood than others. Daughter endorses that the family carefully watches over patient to ensure that he continues to abstain from alcohol use. Patient has not had any alcohol since last visit on 07/31/2020. Since then, he has been following up with chronic care management. Patient endorses that he has no desire to drink alcohol. At the last visit, I had recommended visiting with [facility] since they have interpretors available but patient refuses at this time. Denies any tremors or other withdrawal symptoms.  Type 2 DM Patient DM controlled at this time, last A1c is 7.1 and blood glucose 86. He has tried to incorporate vegetables into his diet, states that he eats fruits at times. Still having trouble cutting out deserts from diet. Checks blood glucose every other day, ranges from 120-160. Denies any hypoglycemic episodes. Compliant on Jardiance and metformin daily, without any complications. Diabetic foot exam was completed during last visit.    Hypertension Patient compliant on losartan without complications. Denies dyspnea or chest pain.   Low back pain Patient regularly sees orthopedics to get corticosteroids injections. Manages pain with a combination of these injections and duloxetine. States that this interferes with his ability to be able to exercise regularly.   OBJECTIVE:   BP 112/60   Pulse 74   Ht 5\' 6"  (1.676 m)   Wt 162 lb (73.5 kg)   SpO2 98%   BMI 26.15 kg/m    General: Patient well-appearing, in no acute distress. HEENT:  normocephalic, atraumatic, supple neck Cardio: RRR, no murmurs or gallops auscultated Resp: lungs clear to auscultation bilaterally, good air movement throughout all lungs fields Abd: soft, nontender, presence of active bowel sounds Ext: radial pulses strong and equal bilaterally, no LE edema noted bilaterally  ASSESSMENT/PLAN:   Alcohol use -continue natrexone -counseling less feasible given language barrier - Health still discussed in case patient agreeable to it in the future -encouraged to continue abstinence  -continue to follow up with chronic care management  -consider ethanol level at next visit    Controlled diabetes mellitus type 2 with complications (HCC) -continue jardiance and metformin as prescribed -last ophthalmology exam was 6 months ago, instructed to schedule another one soon -encouraged to eat plenty of vegetables and low glycemic index foods, cutting down on high sugar containing foods -recheck A1c and BMP at next visit  -continue to follow with chronic care management   Essential hypertension -well-controlled, BP 112/60 today in the office -Continue to take losartan -encouraged to eat a healthy diet and exercise regularly   Lumbar radiculopathy, chronic -instructed to take duloxetine 20 mg bid for pain control -next orthopedics appointment is 10/4 -tiredness that patient has been experiencing likely due to gabapentin. Decreased dose to gabapentin 300 mg once a day. -instructed to walk often as form of exercise to remain active while not further exacerbating pain   Health maintenance  -Patient received both doses of COVID vaccine, -Last colonoscopy performed about 1.5 yrs ago, found polys and recommended to return in 2 years. GI referral placed for colonoscopy.  -  Refills on naltrexone and duloxetine given.  -Shingles vaccine ordered. -Flu vaccine administered today. -Previous labs reviewed.  Reece Leader, DO [facility]

## 2020-09-17 NOTE — Assessment & Plan Note (Addendum)
-  continue natrexone -counseling less feasible given language barrier -[employer] [facility] still discussed in case patient agreeable to it in the future -encouraged to continue abstinence  -continue to follow up with chronic care management  -consider ethanol level at next visit

## 2020-09-17 NOTE — Patient Instructions (Addendum)
It was nice seeing you all! I will get all your refills, we went down on gabapentin 300 mg, 1 tablet only. Please continue to avoid drinking alcohol. Please incorporate vegetables into your diet and avoid carbohydrates and sweets. Please follow up in 6 months for the next appointment. Thank you for allowing me to be a part of your medical care!

## 2020-09-17 NOTE — Assessment & Plan Note (Addendum)
-  well-controlled, BP 112/60 today in the office -Continue to take losartan -encouraged to eat a healthy diet and exercise regularly

## 2020-09-22 ENCOUNTER — Other Ambulatory Visit: Payer: Self-pay | Admitting: Family Medicine

## 2020-09-22 ENCOUNTER — Other Ambulatory Visit: Payer: Self-pay

## 2020-09-22 DIAGNOSIS — G6281 Critical illness polyneuropathy: Secondary | ICD-10-CM

## 2020-09-22 DIAGNOSIS — M5416 Radiculopathy, lumbar region: Secondary | ICD-10-CM | POA: Diagnosis not present

## 2020-09-22 DIAGNOSIS — E118 Type 2 diabetes mellitus with unspecified complications: Secondary | ICD-10-CM

## 2020-09-22 MED ORDER — GABAPENTIN 300 MG PO CAPS: 300.0000 mg | ORAL_CAPSULE | Freq: Every day | ORAL | 1 refills | Status: AC

## 2020-09-22 NOTE — Telephone Encounter (Signed)
Good evening, I have corrected the gabapentin dose. Sorry about that and thank you.

## 2020-09-23 ENCOUNTER — Telehealth: Payer: Self-pay

## 2020-09-23 NOTE — Telephone Encounter (Signed)
Patient does not need stripes, he does not need to be checking glucose levels daily. I just prescribed it to him on request. I let him know of this during our visit, so he does not require them at this time. Thank you

## 2020-09-24 ENCOUNTER — Telehealth: Payer: Self-pay

## 2020-09-24 ENCOUNTER — Telehealth: Payer: Medicare Other

## 2020-09-24 NOTE — Telephone Encounter (Signed)
  Care Management   Outreach Note  09/24/2020 Name: Mark Chase MRN: [id number] DOB: 01/08/1951  Referred by: Reece Leader, DO Reason for referral : Appointment (DM II)   An unsuccessful telephone outreach was attempted today. The patient was referred to the case management team for assistance with care management and care coordination.  Called x 2 with interpreter [phone] no answer and unable to leave a message.  Follow Up Plan: The care management team will reach out to the patient again over the next 7-14 days.   [name] RN, BSN, [facility] Care Management Coordinator [facility] Phone: [phone] Fax: [phone]

## 2020-09-25 ENCOUNTER — Telehealth: Payer: Self-pay | Admitting: *Deleted

## 2020-09-25 ENCOUNTER — Other Ambulatory Visit: Payer: Self-pay | Admitting: Family Medicine

## 2020-09-25 DIAGNOSIS — E118 Type 2 diabetes mellitus with unspecified complications: Secondary | ICD-10-CM

## 2020-09-25 MED ORDER — GLUCOSE BLOOD VI STRP: ORAL_STRIP | 1 refills | Status: AC

## 2020-09-25 NOTE — Telephone Encounter (Signed)
Patient refuses to reschedule call with RN CM sent follow up.

## 2020-09-25 NOTE — Chronic Care Management (AMB) (Signed)
°  Care Management   Note  09/25/2020 Name: Mark Chase MRN: [id number] DOB: [birth year]  Mark Chase is a 69 y.o. year old male who is a primary care patient of Robyne Peers, Anupa, DO and is actively engaged with the care management team. I reached out to Judd Lien by phone today to assist with re-scheduling a follow up visit with the RN Case Manager.  Follow up plan: Patient declines further follow up and engagement by the care management team. Appropriate care team members and provider have been notified via electronic communication.   Gwenevere Ghazi  Care Guide, Embedded Care Coordination [facility] Management

## 2020-09-29 NOTE — Telephone Encounter (Signed)
Spoke with [employer] rep. Informed her that pt no longer need the test strips that the provider discontinued them. Morrie Sheldon stated that the pt did put up strips 5 days ago saying the he needed them. I also had her check to make sure the the system is showing that the GABAPENTIN dose had been corrected. She stated that it is correct in their system now. Aquilla Solian, CMA

## 2020-10-06 ENCOUNTER — Other Ambulatory Visit: Payer: Self-pay

## 2020-10-06 DIAGNOSIS — E785 Hyperlipidemia, unspecified: Secondary | ICD-10-CM

## 2020-10-10 DIAGNOSIS — M5416 Radiculopathy, lumbar region: Secondary | ICD-10-CM | POA: Diagnosis not present

## 2020-10-16 DIAGNOSIS — I10 Essential (primary) hypertension: Secondary | ICD-10-CM | POA: Diagnosis not present

## 2020-10-16 DIAGNOSIS — E1165 Type 2 diabetes mellitus with hyperglycemia: Secondary | ICD-10-CM | POA: Diagnosis not present

## 2020-10-16 DIAGNOSIS — E782 Mixed hyperlipidemia: Secondary | ICD-10-CM | POA: Diagnosis not present

## 2020-10-16 DIAGNOSIS — E1142 Type 2 diabetes mellitus with diabetic polyneuropathy: Secondary | ICD-10-CM | POA: Diagnosis not present

## 2020-10-16 DIAGNOSIS — E113599 Type 2 diabetes mellitus with proliferative diabetic retinopathy without macular edema, unspecified eye: Secondary | ICD-10-CM | POA: Diagnosis not present

## 2020-10-20 DIAGNOSIS — M5416 Radiculopathy, lumbar region: Secondary | ICD-10-CM | POA: Diagnosis not present

## 2020-11-03 DIAGNOSIS — I129 Hypertensive chronic kidney disease with stage 1 through stage 4 chronic kidney disease, or unspecified chronic kidney disease: Secondary | ICD-10-CM | POA: Diagnosis not present

## 2020-11-03 DIAGNOSIS — N1831 Chronic kidney disease, stage 3a: Secondary | ICD-10-CM | POA: Diagnosis not present

## 2020-11-03 DIAGNOSIS — E113599 Type 2 diabetes mellitus with proliferative diabetic retinopathy without macular edema, unspecified eye: Secondary | ICD-10-CM | POA: Diagnosis not present

## 2020-11-03 DIAGNOSIS — E7849 Other hyperlipidemia: Secondary | ICD-10-CM | POA: Diagnosis not present

## 2020-11-03 DIAGNOSIS — E1165 Type 2 diabetes mellitus with hyperglycemia: Secondary | ICD-10-CM | POA: Diagnosis not present

## 2020-11-05 DIAGNOSIS — M5416 Radiculopathy, lumbar region: Secondary | ICD-10-CM | POA: Diagnosis not present

## 2020-12-03 DIAGNOSIS — E1142 Type 2 diabetes mellitus with diabetic polyneuropathy: Secondary | ICD-10-CM | POA: Diagnosis not present

## 2020-12-03 DIAGNOSIS — Z0000 Encounter for general adult medical examination without abnormal findings: Secondary | ICD-10-CM | POA: Diagnosis not present

## 2020-12-03 DIAGNOSIS — I10 Essential (primary) hypertension: Secondary | ICD-10-CM | POA: Diagnosis not present

## 2020-12-03 DIAGNOSIS — E7849 Other hyperlipidemia: Secondary | ICD-10-CM | POA: Diagnosis not present

## 2021-01-26 DIAGNOSIS — G8929 Other chronic pain: Secondary | ICD-10-CM | POA: Diagnosis not present

## 2021-01-26 DIAGNOSIS — M5136 Other intervertebral disc degeneration, lumbar region: Secondary | ICD-10-CM | POA: Diagnosis not present

## 2021-01-26 DIAGNOSIS — M5442 Lumbago with sciatica, left side: Secondary | ICD-10-CM | POA: Diagnosis not present

## 2021-03-25 DIAGNOSIS — E7849 Other hyperlipidemia: Secondary | ICD-10-CM | POA: Diagnosis not present

## 2021-03-25 DIAGNOSIS — I10 Essential (primary) hypertension: Secondary | ICD-10-CM | POA: Diagnosis not present

## 2021-03-25 DIAGNOSIS — E1142 Type 2 diabetes mellitus with diabetic polyneuropathy: Secondary | ICD-10-CM | POA: Diagnosis not present

## 2021-03-25 DIAGNOSIS — Z7984 Long term (current) use of oral hypoglycemic drugs: Secondary | ICD-10-CM | POA: Diagnosis not present

## 2021-03-25 DIAGNOSIS — E113553 Type 2 diabetes mellitus with stable proliferative diabetic retinopathy, bilateral: Secondary | ICD-10-CM | POA: Diagnosis not present

## 2021-04-03 DIAGNOSIS — E782 Mixed hyperlipidemia: Secondary | ICD-10-CM | POA: Diagnosis not present

## 2021-04-03 DIAGNOSIS — N1831 Chronic kidney disease, stage 3a: Secondary | ICD-10-CM | POA: Diagnosis not present

## 2021-04-03 DIAGNOSIS — Z7982 Long term (current) use of aspirin: Secondary | ICD-10-CM | POA: Diagnosis not present

## 2021-04-03 DIAGNOSIS — E113553 Type 2 diabetes mellitus with stable proliferative diabetic retinopathy, bilateral: Secondary | ICD-10-CM | POA: Diagnosis not present

## 2021-04-03 DIAGNOSIS — G6281 Critical illness polyneuropathy: Secondary | ICD-10-CM | POA: Diagnosis not present

## 2021-04-03 DIAGNOSIS — I129 Hypertensive chronic kidney disease with stage 1 through stage 4 chronic kidney disease, or unspecified chronic kidney disease: Secondary | ICD-10-CM | POA: Diagnosis not present

## 2021-04-03 DIAGNOSIS — I10 Essential (primary) hypertension: Secondary | ICD-10-CM | POA: Diagnosis not present

## 2021-04-03 DIAGNOSIS — Z7984 Long term (current) use of oral hypoglycemic drugs: Secondary | ICD-10-CM | POA: Diagnosis not present

## 2021-04-03 DIAGNOSIS — Z79899 Other long term (current) drug therapy: Secondary | ICD-10-CM | POA: Diagnosis not present

## 2021-04-03 DIAGNOSIS — Z1329 Encounter for screening for other suspected endocrine disorder: Secondary | ICD-10-CM | POA: Diagnosis not present

## 2021-05-19 DIAGNOSIS — H5500 Unspecified nystagmus: Secondary | ICD-10-CM | POA: Diagnosis not present

## 2021-05-19 DIAGNOSIS — H26493 Other secondary cataract, bilateral: Secondary | ICD-10-CM | POA: Diagnosis not present

## 2021-05-19 DIAGNOSIS — H40012 Open angle with borderline findings, low risk, left eye: Secondary | ICD-10-CM | POA: Diagnosis not present

## 2021-05-19 DIAGNOSIS — H04123 Dry eye syndrome of bilateral lacrimal glands: Secondary | ICD-10-CM | POA: Diagnosis not present

## 2021-05-21 NOTE — Progress Notes (Deleted)
    SUBJECTIVE:   CHIEF COMPLAINT / HPI:   ***  PERTINENT  PMH / PSH: ***  OBJECTIVE:   There were no vitals taken for this visit.  General: ***, NAD CV: RRR, no murmurs*** Pulm: CTAB, no wheezes or rales  ASSESSMENT/PLAN:   No problem-specific Assessment & Plan notes found for this encounter.     Jeanelle Dake, MD [facility]   {    This will disappear when note is signed, click to select method of visit    :1}  

## 2021-05-26 ENCOUNTER — Ambulatory Visit: Payer: Medicare Other | Admitting: Family Medicine

## 2021-06-02 ENCOUNTER — Ambulatory Visit: Payer: Medicare Other | Admitting: Family Medicine

## 2021-06-26 DIAGNOSIS — Z7984 Long term (current) use of oral hypoglycemic drugs: Secondary | ICD-10-CM | POA: Diagnosis not present

## 2021-06-26 DIAGNOSIS — E1142 Type 2 diabetes mellitus with diabetic polyneuropathy: Secondary | ICD-10-CM | POA: Diagnosis not present

## 2021-06-26 DIAGNOSIS — E113553 Type 2 diabetes mellitus with stable proliferative diabetic retinopathy, bilateral: Secondary | ICD-10-CM | POA: Diagnosis not present

## 2021-06-26 DIAGNOSIS — I10 Essential (primary) hypertension: Secondary | ICD-10-CM | POA: Diagnosis not present

## 2021-06-26 DIAGNOSIS — E7849 Other hyperlipidemia: Secondary | ICD-10-CM | POA: Diagnosis not present

## 2021-07-06 DIAGNOSIS — I10 Essential (primary) hypertension: Secondary | ICD-10-CM | POA: Diagnosis not present

## 2021-07-06 DIAGNOSIS — E1142 Type 2 diabetes mellitus with diabetic polyneuropathy: Secondary | ICD-10-CM | POA: Diagnosis not present

## 2021-07-06 DIAGNOSIS — E7849 Other hyperlipidemia: Secondary | ICD-10-CM | POA: Diagnosis not present

## 2021-07-06 DIAGNOSIS — R9431 Abnormal electrocardiogram [ECG] [EKG]: Secondary | ICD-10-CM | POA: Diagnosis not present

## 2021-07-10 DIAGNOSIS — I700 Atherosclerosis of aorta: Secondary | ICD-10-CM | POA: Diagnosis not present

## 2021-07-10 DIAGNOSIS — I10 Essential (primary) hypertension: Secondary | ICD-10-CM | POA: Diagnosis not present

## 2021-07-14 DIAGNOSIS — E781 Pure hyperglyceridemia: Secondary | ICD-10-CM | POA: Diagnosis not present

## 2021-07-14 DIAGNOSIS — H3552 Pigmentary retinal dystrophy: Secondary | ICD-10-CM | POA: Diagnosis not present

## 2021-07-14 DIAGNOSIS — E559 Vitamin D deficiency, unspecified: Secondary | ICD-10-CM | POA: Diagnosis not present

## 2021-07-14 DIAGNOSIS — Z9889 Other specified postprocedural states: Secondary | ICD-10-CM | POA: Diagnosis not present

## 2021-07-14 DIAGNOSIS — G8929 Other chronic pain: Secondary | ICD-10-CM | POA: Diagnosis not present

## 2021-07-14 DIAGNOSIS — M25511 Pain in right shoulder: Secondary | ICD-10-CM | POA: Diagnosis not present

## 2021-07-14 DIAGNOSIS — Z79899 Other long term (current) drug therapy: Secondary | ICD-10-CM | POA: Diagnosis not present

## 2021-08-27 DIAGNOSIS — H40003 Preglaucoma, unspecified, bilateral: Secondary | ICD-10-CM | POA: Diagnosis not present

## 2021-08-27 DIAGNOSIS — H31013 Macula scars of posterior pole (postinflammatory) (post-traumatic), bilateral: Secondary | ICD-10-CM | POA: Diagnosis not present

## 2021-09-30 DIAGNOSIS — Z7984 Long term (current) use of oral hypoglycemic drugs: Secondary | ICD-10-CM | POA: Diagnosis not present

## 2021-09-30 DIAGNOSIS — E538 Deficiency of other specified B group vitamins: Secondary | ICD-10-CM | POA: Diagnosis not present

## 2021-09-30 DIAGNOSIS — E113553 Type 2 diabetes mellitus with stable proliferative diabetic retinopathy, bilateral: Secondary | ICD-10-CM | POA: Diagnosis not present

## 2021-09-30 DIAGNOSIS — E1142 Type 2 diabetes mellitus with diabetic polyneuropathy: Secondary | ICD-10-CM | POA: Diagnosis not present

## 2021-09-30 DIAGNOSIS — E781 Pure hyperglyceridemia: Secondary | ICD-10-CM | POA: Diagnosis not present

## 2021-09-30 DIAGNOSIS — I10 Essential (primary) hypertension: Secondary | ICD-10-CM | POA: Diagnosis not present

## 2021-11-05 DIAGNOSIS — E569 Vitamin deficiency, unspecified: Secondary | ICD-10-CM | POA: Diagnosis not present

## 2021-11-05 DIAGNOSIS — M5441 Lumbago with sciatica, right side: Secondary | ICD-10-CM | POA: Diagnosis not present

## 2021-11-05 DIAGNOSIS — M129 Arthropathy, unspecified: Secondary | ICD-10-CM | POA: Diagnosis not present

## 2021-11-05 DIAGNOSIS — M5442 Lumbago with sciatica, left side: Secondary | ICD-10-CM | POA: Diagnosis not present

## 2021-11-05 DIAGNOSIS — E7849 Other hyperlipidemia: Secondary | ICD-10-CM | POA: Diagnosis not present

## 2021-11-05 DIAGNOSIS — I10 Essential (primary) hypertension: Secondary | ICD-10-CM | POA: Diagnosis not present

## 2021-11-05 DIAGNOSIS — E1142 Type 2 diabetes mellitus with diabetic polyneuropathy: Secondary | ICD-10-CM | POA: Diagnosis not present

## 2021-11-05 DIAGNOSIS — G8929 Other chronic pain: Secondary | ICD-10-CM | POA: Diagnosis not present

## 2021-11-16 IMAGING — DX DG CHEST 1V PORT
1 series · 1 of 1 positions shown · non-contrast
Comparison: Radiograph 10/21/2006

CLINICAL DATA: Chest pain

EXAM:
PORTABLE CHEST 1 VIEW

[chest]
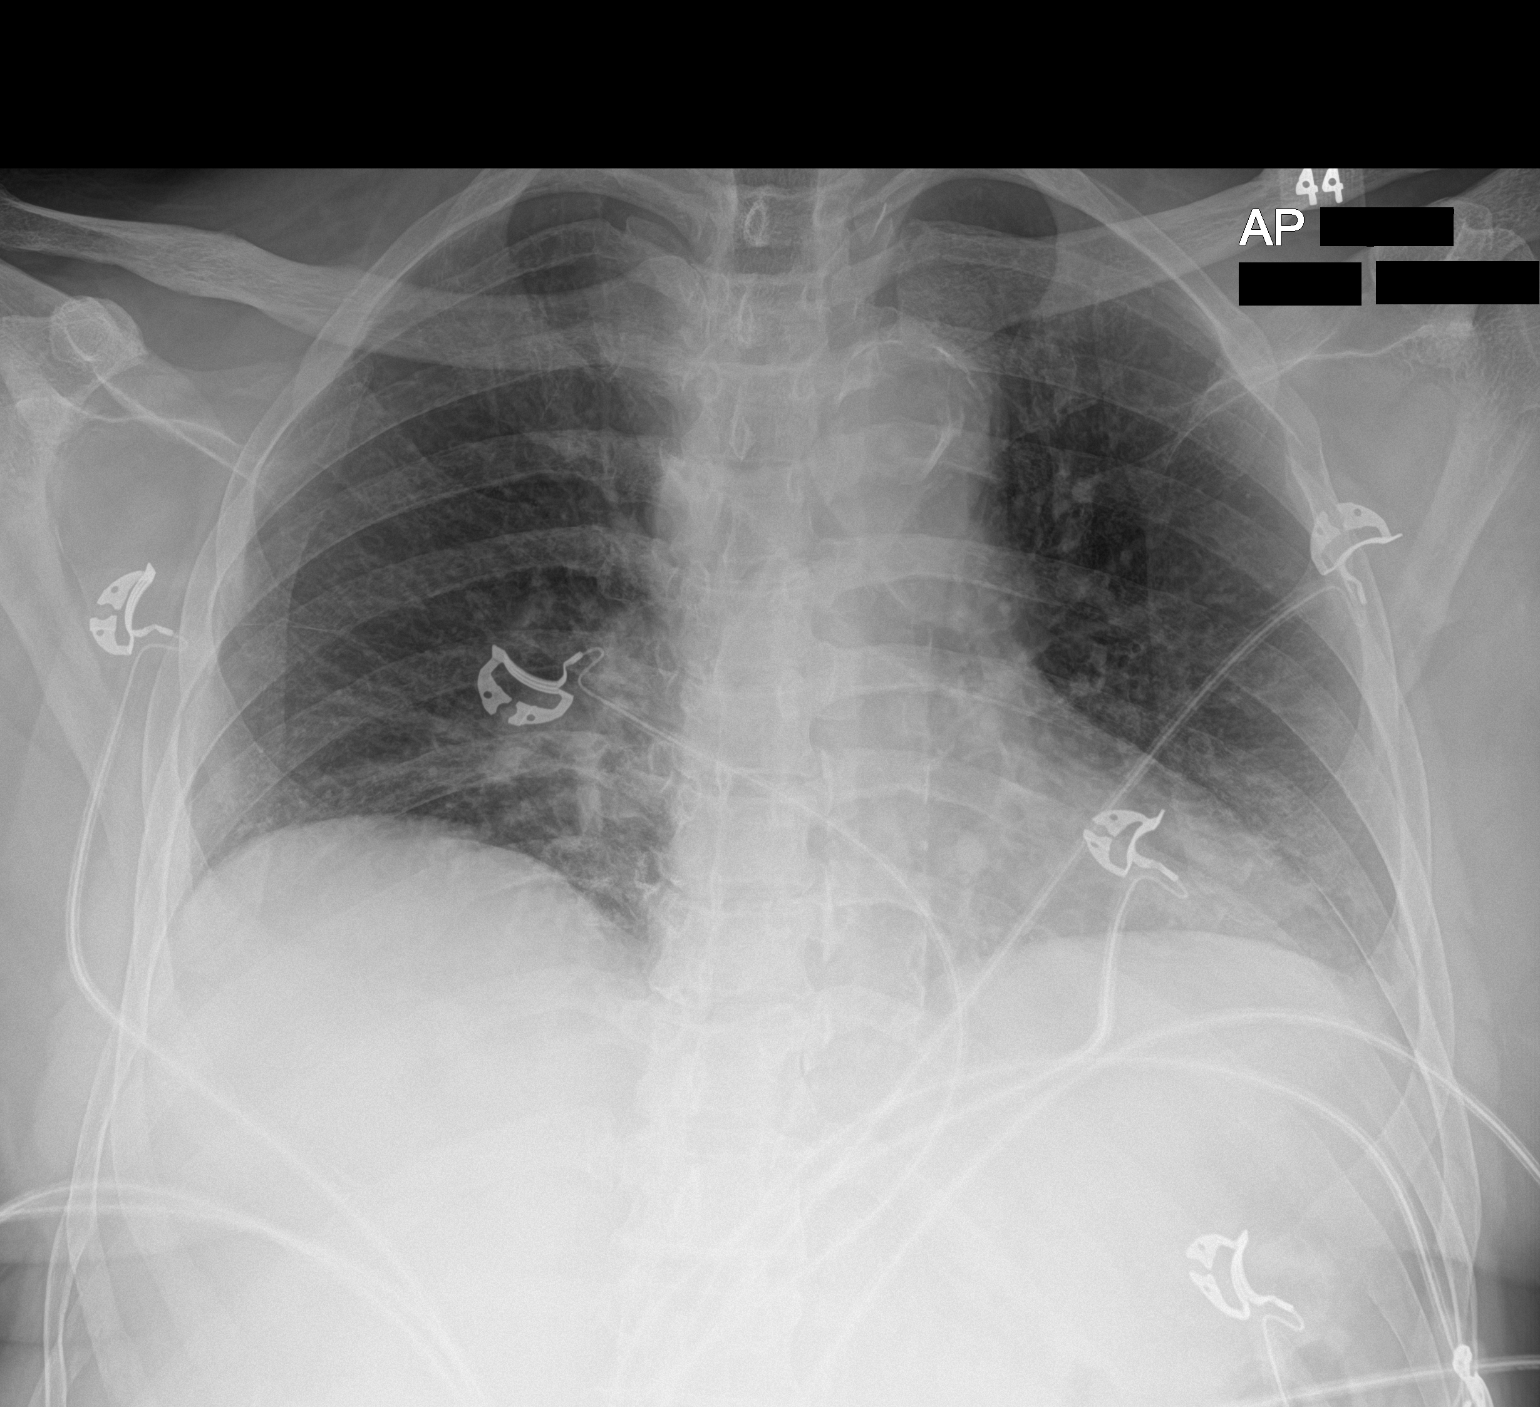

[1 of 1 positions shown; findings below may reference images not displayed]

FINDINGS: Low lung volumes with streaky basilar opacities favoring
atelectasis. Central vascular crowding. No consolidation, features
of edema, pneumothorax, or effusion. The aorta is calcified. The
remaining cardiomediastinal contours are unremarkable. No acute
osseous or soft tissue abnormality. Degenerative changes are present
in the imaged spine and shoulders.
IMPRESSION: Low lung volumes with streaky basilar opacities favoring
atelectasis.

## 2021-12-07 ENCOUNTER — Ambulatory Visit: Payer: Medicare Other | Admitting: Family Medicine

## 2021-12-29 DIAGNOSIS — R69 Illness, unspecified: Secondary | ICD-10-CM | POA: Diagnosis not present

## 2021-12-31 DIAGNOSIS — R053 Chronic cough: Secondary | ICD-10-CM | POA: Diagnosis not present

## 2021-12-31 DIAGNOSIS — R202 Paresthesia of skin: Secondary | ICD-10-CM | POA: Diagnosis not present

## 2021-12-31 DIAGNOSIS — G8929 Other chronic pain: Secondary | ICD-10-CM | POA: Diagnosis not present

## 2021-12-31 DIAGNOSIS — E781 Pure hyperglyceridemia: Secondary | ICD-10-CM | POA: Diagnosis not present

## 2021-12-31 DIAGNOSIS — Z9889 Other specified postprocedural states: Secondary | ICD-10-CM | POA: Diagnosis not present

## 2021-12-31 DIAGNOSIS — M25511 Pain in right shoulder: Secondary | ICD-10-CM | POA: Diagnosis not present

## 2021-12-31 DIAGNOSIS — R69 Illness, unspecified: Secondary | ICD-10-CM | POA: Diagnosis not present

## 2021-12-31 DIAGNOSIS — E1142 Type 2 diabetes mellitus with diabetic polyneuropathy: Secondary | ICD-10-CM | POA: Diagnosis not present

## 2022-01-01 NOTE — Progress Notes (Signed)
Office Visit Note  Patient: Mark Chase             Date of Birth: 06-Mar-1951           MRN: [id number]             PCP: Donney Dice, DO Referring: Donney Dice, DO Visit Date: 01/14/2022 Occupation: @GUAROCC @  Subjective:  Pain in multiple joints  History of Present Illness: Brek Gwinn is a 71 y.o. male seen in consultation per request of his PCP.  According the patient he has had pain and discomfort in his joints for many years.  He states he used to ride motorcycle in the past.  He has had neck and lower back pain for many years.  He also had history of bilateral shoulder joint pain for many years.  He recalls having right shoulder joint surgery in [place] many years ago.  He states his shoulder joint pain improved after that.  He has occasional discomfort in his right shoulder.  He continues to have some discomfort in his left shoulder.  He also has chronic neck pain.  He had MRI in the past.  He has chronic discomfort in his lower back pain without any radiculopathy.  He describes pain over bilateral trochanteric bursa more prominent on the left side.  He states that at night sometimes the pain radiates down into his legs.  He is trying to lose weight which is difficult for him.  He has had umbilical hernia repair in the past.  Activities of Daily Living:  Patient reports morning stiffness for several minutes.   Patient Reports nocturnal pain.  Difficulty dressing/grooming: Denies Difficulty climbing stairs: Reports Difficulty getting out of chair: Denies Difficulty using hands for taps, buttons, cutlery, and/or writing: Denies  Review of Systems  Constitutional:  Negative for fatigue.  HENT:  Negative for mouth sores, mouth dryness and nose dryness.   Eyes:  Positive for dryness. Negative for pain and itching.  Respiratory:  Negative for shortness of breath and difficulty breathing.   Cardiovascular:  Negative for chest pain and palpitations.  Gastrointestinal:  Negative for  blood in stool, constipation and diarrhea.  Endocrine: Negative for increased urination.  Genitourinary:  Negative for difficulty urinating.  Musculoskeletal:  Positive for joint pain, joint pain, myalgias, morning stiffness, muscle tenderness and myalgias. Negative for joint swelling.  Skin:  Negative for color change, rash, redness and sensitivity to sunlight.  Allergic/Immunologic: Negative for susceptible to infections.  Neurological:  Positive for numbness. Negative for dizziness, headaches, memory loss and weakness.  Hematological:  Negative for bruising/bleeding tendency.  Psychiatric/Behavioral:  Negative for confusion.    PMFS History:  Patient Active Problem List   Diagnosis Date Noted   DDD (degenerative disc disease), cervical 01/14/2022   Primary osteoarthritis of both hands 01/14/2022   Alcohol use 06/03/2020   Alcohol use disorder, mild, abuse 05/14/2020   Chronic bilateral low back pain without sciatica 04/09/2020   Controlled diabetes mellitus type 2 with complications ([place]) XX123456   Lumbar radiculopathy, chronic 10/13/2019   Hyperlipidemia 10/13/2019   Polyneuropathy associated with critical illness ([place]) 10/13/2019   Essential hypertension 10/13/2019   Blindness and low vision 10/13/2019   Chronic left shoulder pain 10/13/2019   Tobacco dipper 01/18/2012   Cardiovascular risk factor 01/18/2012    Past Medical History:  Diagnosis Date   Arthritis    Diabetes mellitus    Hyperlipidemia    Hypertension    Umbilical hernia     Family History  Problem Relation Age of Onset   Hypertension Mother 30   Coronary artery disease Father 68   Past Surgical History:  Procedure Laterality Date   INSERTION OF MESH N/A 08/28/2015   Procedure: INSERTION OF MESH;  Surgeon: Greer Pickerel, MD;  Location: [facility];  Service: General;  Laterality: N/A;   SHOULDER SURGERY     Right   UMBILICAL HERNIA REPAIR N/A 08/28/2015   Procedure: OPEN REPAIR UMBILICAL HERNIA;  Surgeon:  Greer Pickerel, MD;  Location: [facility];  Service: General;  Laterality: N/A;   Social History   Social History Narrative   Not on file   Immunization History  Administered Date(s) Administered   Influenza, High Dose Seasonal PF 08/23/2017   Influenza, Seasonal, Injecte, Preservative Fre 10/04/2015   Influenza,inj,Quad PF,6+ Mos 09/17/2020   Influenza,inj,Quad PF,6-35 Mos 09/20/2019   Influenza-Unspecified 10/04/2015, 09/13/2016   Moderna Sars-Covid-2 Vaccination 02/01/2020, 03/03/2020   Pneumococcal Conjugate-13 04/30/2016   Pneumococcal Polysaccharide-23 05/22/2013, 08/23/2017   Tdap 11/30/2019     Objective: Vital Signs: BP 138/76 (BP Location: Left Arm, Patient Position: Sitting, Cuff Size: Normal)    Pulse 91    Ht 5\' 5"  (1.651 m)    Wt 167 lb 12.8 oz (76.1 kg)    BMI 27.92 kg/m    Physical Exam Vitals and nursing note reviewed.  Constitutional:      Appearance: He is well-developed.  HENT:     Head: Normocephalic and atraumatic.  Eyes:     Conjunctiva/sclera: Conjunctivae normal.     Pupils: Pupils are equal, round, and reactive to light.  Cardiovascular:     Rate and Rhythm: Normal rate and regular rhythm.     Heart sounds: Normal heart sounds.  Pulmonary:     Effort: Pulmonary effort is normal.     Breath sounds: Normal breath sounds.  Abdominal:     General: Bowel sounds are normal.     Palpations: Abdomen is soft.  Musculoskeletal:     Cervical back: Normal range of motion and neck supple.  Skin:    General: Skin is warm and dry.     Capillary Refill: Capillary refill takes less than 2 seconds.  Neurological:     Mental Status: He is alert and oriented to person, place, and time.  Psychiatric:        Behavior: Behavior normal.     Musculoskeletal Exam: C-spine was in good range of motion without any discomfort.  Shoulder joints with good range of motion with some discomfort in his left shoulder joint.  Elbow joints wrist joints MCPs PIPs and DIPs with good  range of motion.  He had bilateral DIP thickening consistent with osteoarthritis.  Hip joints with good range of motion.  He had tenderness over bilateral trochanteric bursa.  Knee joints with good range of motion without any warmth swelling or effusion.  There was no tenderness over ankles or MTPs.  CDAI Exam: CDAI Score: -- Patient Global: --; Provider Global: -- Swollen: --; Tender: -- Joint Exam 01/14/2022   No joint exam has been documented for this visit   There is currently no information documented on the homunculus. Go to the Rheumatology activity and complete the homunculus joint exam.  Investigation: No additional findings.  Imaging: No results found.  Recent Labs: Lab Results  Component Value Date   WBC 7.0 05/02/2020   HGB 15.0 05/02/2020   PLT 187 05/02/2020   NA 139 07/31/2020   K 4.2 07/31/2020   CL 102 07/31/2020  CO2 22 07/31/2020   GLUCOSE 83 07/31/2020   BUN 10 07/31/2020   CREATININE 1.26 07/31/2020   BILITOT 0.5 06/02/2020   ALKPHOS 41 (L) 06/02/2020   AST 26 06/02/2020   ALT 22 06/02/2020   PROT 7.0 06/02/2020   ALBUMIN 4.5 06/02/2020   CALCIUM 9.9 07/31/2020   GFRAA 67 07/31/2020   IMPRESSION:  1. Degenerative change of the lumbar spine without acute osseous  process.  2. Small L1-2 and L2-3 disc extrusions.  3. No canal stenosis. Neural foraminal narrowing L3-4 through L5-S1:  Moderate on the LEFT with soft tissue within the LEFT neural foramen  seen with synovial cyst or less likely disc extrusion.   Electronically Signed    By: Elon Alas M.D.    On: 01/11/2019 05:57  Speciality Comments: No specialty comments available.  Procedures:  No procedures performed Allergies: Lisinopril   Assessment / Plan:     Visit Diagnoses: Polyarthralgia-patient complains of pain in multiple joints.  No synovitis was noted.  Chronic pain of both shoulders-he complains of pain and discomfort in his bilateral shoulders.  He states he had  right shoulder joint surgery in [place] several years ago.  He has intermittent discomfort in his left shoulder joint.  His shoulder joint was in full range of motion.  I gave him a handout on bilateral shoulder joint exercises.  Primary osteoarthritis of both hands-he had bilateral DIP thickening.  He has some stiffness but no discomfort in his hands.  Joint protection was discussed.  Trochanteric bursitis of both hips-he complains of pain in his bilateral trochanteric bursa.  He had difficulty walking due to trochanteric bursitis.  He also describes nocturnal pain.  Counseling regarding trochanteric bursitis was provided.  Handout was given.  A handout on exercises was also given.  I will refer him to physical therapy.  DDD (degenerative disc disease), cervical-he has some neck stiffness.  He had good range of motion without discomfort.  He denies any radiculopathy.  I reviewed the CT of his cervical spine from November 2014 which showed multilevel moderate to severe degenerative disc disease, worse at C5-C6 and C6-C7.  Chronic bilateral low back pain with bilateral sciatica-he complains of lower back pain.  He had MRI of his lumbar spine at [facility] in January 2020.  Which was also reviewed with the patient.  MRI showed mild degenerative changes.  He has no radiculopathy.  He will benefit from physical therapy.  BMI 27.92-he is concerned about obesity and diabetes.  He is trying to lose weight and has been unsuccessful.  He also had umbilical hernia in the past which required surgery.  I gave him information regarding the weight management clinic.  Essential hypertension-his blood pressure was normal today.  Controlled diabetes mellitus type 2 with complications, unspecified whether long term insulin use ([facility])  Polyneuropathy associated with critical illness ([place])  History of hyperlipidemia  Tobacco dipper - quit using 2022  Blindness and low vision -congenital defect per patient , right  eye  Orders: Orders Placed This Encounter  Procedures   Ambulatory referral to Physical Therapy   No orders of the defined types were placed in this encounter.    Follow-Up Instructions: Return if symptoms worsen or fail to improve, for Pain in multiple joints.   Bo Merino, MD  Note - This record has been created using Editor, commissioning.  Chart creation errors have been sought, but may not always  have been located. Such creation errors do not reflect on  the standard of medical care.

## 2022-01-04 ENCOUNTER — Ambulatory Visit: Payer: Medicare Other | Admitting: Rheumatology

## 2022-01-08 NOTE — Progress Notes (Deleted)
Office Visit Note  Patient: Mark Chase             Date of Birth: [birth year]           MRN: [id number]             PCP: Donney Dice, DO Referring: Donney Dice, DO Visit Date: 01/21/2022 Occupation: @GUAROCC @  Subjective:  No chief complaint on file.   History of Present Illness: Mark Chase is a 71 y.o. male ***   Activities of Daily Living:  Patient reports morning stiffness for *** {minute/hour:19697}.   Patient {ACTIONS;DENIES/REPORTS:21021675::"Denies"} nocturnal pain.  Difficulty dressing/grooming: {ACTIONS;DENIES/REPORTS:21021675::"Denies"} Difficulty climbing stairs: {ACTIONS;DENIES/REPORTS:21021675::"Denies"} Difficulty getting out of chair: {ACTIONS;DENIES/REPORTS:21021675::"Denies"} Difficulty using hands for taps, buttons, cutlery, and/or writing: {ACTIONS;DENIES/REPORTS:21021675::"Denies"}  No Rheumatology ROS completed.   PMFS History:  Patient Active Problem List   Diagnosis Date Noted   Alcohol use 06/03/2020   Alcohol use disorder, mild, abuse 05/14/2020   Chronic bilateral low back pain without sciatica 04/09/2020   Controlled diabetes mellitus type 2 with complications ([place]) XX123456   Lumbar radiculopathy, chronic 10/13/2019   Hyperlipidemia 10/13/2019   Polyneuropathy associated with critical illness ([place]) 10/13/2019   Essential hypertension 10/13/2019   Blindness and low vision 10/13/2019   Chronic left shoulder pain 10/13/2019   Tobacco dipper 01/18/2012   Cardiovascular risk factor 01/18/2012    Past Medical History:  Diagnosis Date   Arthritis    Diabetes mellitus    Hyperlipidemia    Hypertension    Umbilical hernia     Family History  Problem Relation Age of Onset   Hypertension Mother 57   Coronary artery disease Father 13   Past Surgical History:  Procedure Laterality Date   INSERTION OF MESH N/A 08/28/2015   Procedure: INSERTION OF MESH;  Surgeon: Greer Pickerel, MD;  Location: [facility];  Service: General;  Laterality: N/A;    SHOULDER SURGERY     Right   UMBILICAL HERNIA REPAIR N/A 08/28/2015   Procedure: OPEN REPAIR UMBILICAL HERNIA;  Surgeon: Greer Pickerel, MD;  Location: [facility];  Service: General;  Laterality: N/A;   Social History   Social History Narrative   Not on file   Immunization History  Administered Date(s) Administered   Influenza, High Dose Seasonal PF 08/23/2017   Influenza, Seasonal, Injecte, Preservative Fre 10/04/2015   Influenza,inj,Quad PF,6+ Mos 09/17/2020   Influenza,inj,Quad PF,6-35 Mos 09/20/2019   Influenza-Unspecified 10/04/2015, 09/13/2016   Moderna Sars-Covid-2 Vaccination 02/01/2020, 03/03/2020   Pneumococcal Conjugate-13 04/30/2016   Pneumococcal Polysaccharide-23 05/22/2013, 08/23/2017   Tdap 11/30/2019     Objective: Vital Signs: There were no vitals taken for this visit.   Physical Exam   Musculoskeletal Exam: ***  CDAI Exam: CDAI Score: -- Patient Global: --; Provider Global: -- Swollen: --; Tender: -- Joint Exam 01/21/2022   No joint exam has been documented for this visit   There is currently no information documented on the homunculus. Go to the Rheumatology activity and complete the homunculus joint exam.  Investigation: No additional findings.  Imaging: No results found.  Recent Labs: Lab Results  Component Value Date   WBC 7.0 05/02/2020   HGB 15.0 05/02/2020   PLT 187 05/02/2020   NA 139 07/31/2020   K 4.2 07/31/2020   CL 102 07/31/2020   CO2 22 07/31/2020   GLUCOSE 83 07/31/2020   BUN 10 07/31/2020   CREATININE 1.26 07/31/2020   BILITOT 0.5 06/02/2020   ALKPHOS 41 (L) 06/02/2020   AST 26 06/02/2020   ALT  22 06/02/2020   PROT 7.0 06/02/2020   ALBUMIN 4.5 06/02/2020   CALCIUM 9.9 07/31/2020   GFRAA 67 07/31/2020      Speciality Comments: No specialty comments available.  Procedures:  No procedures performed Allergies: Lisinopril   Assessment / Plan:     Visit Diagnoses: No diagnosis found.  Orders: No orders of the defined  types were placed in this encounter.  No orders of the defined types were placed in this encounter.   Face-to-face time spent with patient was *** minutes. Greater than 50% of time was spent in counseling and coordination of care.  Follow-Up Instructions: No follow-ups on file.   Bo Merino, MD  Note - This record has been created using Editor, commissioning.  Chart creation errors have been sought, but may not always  have been located. Such creation errors do not reflect on  the standard of medical care.

## 2022-01-14 ENCOUNTER — Other Ambulatory Visit: Payer: Self-pay

## 2022-01-14 ENCOUNTER — Encounter: Payer: Self-pay | Admitting: Rheumatology

## 2022-01-14 ENCOUNTER — Ambulatory Visit (INDEPENDENT_AMBULATORY_CARE_PROVIDER_SITE_OTHER): Payer: Medicare Other | Admitting: Rheumatology

## 2022-01-14 VITALS — BP 138/76 | HR 91 | Ht 65.0 in | Wt 167.8 lb

## 2022-01-14 DIAGNOSIS — Z8639 Personal history of other endocrine, nutritional and metabolic disease: Secondary | ICD-10-CM | POA: Diagnosis not present

## 2022-01-14 DIAGNOSIS — H5410 Blindness, one eye, low vision other eye, unspecified eyes: Secondary | ICD-10-CM

## 2022-01-14 DIAGNOSIS — G6281 Critical illness polyneuropathy: Secondary | ICD-10-CM

## 2022-01-14 DIAGNOSIS — M19041 Primary osteoarthritis, right hand: Secondary | ICD-10-CM

## 2022-01-14 DIAGNOSIS — M5030 Other cervical disc degeneration, unspecified cervical region: Secondary | ICD-10-CM | POA: Diagnosis not present

## 2022-01-14 DIAGNOSIS — M25511 Pain in right shoulder: Secondary | ICD-10-CM | POA: Diagnosis not present

## 2022-01-14 DIAGNOSIS — I10 Essential (primary) hypertension: Secondary | ICD-10-CM | POA: Diagnosis not present

## 2022-01-14 DIAGNOSIS — Z720 Tobacco use: Secondary | ICD-10-CM

## 2022-01-14 DIAGNOSIS — M7061 Trochanteric bursitis, right hip: Secondary | ICD-10-CM | POA: Diagnosis not present

## 2022-01-14 DIAGNOSIS — M5442 Lumbago with sciatica, left side: Secondary | ICD-10-CM

## 2022-01-14 DIAGNOSIS — M19042 Primary osteoarthritis, left hand: Secondary | ICD-10-CM

## 2022-01-14 DIAGNOSIS — E118 Type 2 diabetes mellitus with unspecified complications: Secondary | ICD-10-CM | POA: Diagnosis not present

## 2022-01-14 DIAGNOSIS — M7062 Trochanteric bursitis, left hip: Secondary | ICD-10-CM

## 2022-01-14 DIAGNOSIS — M5416 Radiculopathy, lumbar region: Secondary | ICD-10-CM

## 2022-01-14 DIAGNOSIS — G8929 Other chronic pain: Secondary | ICD-10-CM

## 2022-01-14 DIAGNOSIS — Z6827 Body mass index (BMI) 27.0-27.9, adult: Secondary | ICD-10-CM

## 2022-01-14 DIAGNOSIS — M5441 Lumbago with sciatica, right side: Secondary | ICD-10-CM

## 2022-01-14 DIAGNOSIS — M2550 Pain in unspecified joint: Secondary | ICD-10-CM | POA: Diagnosis not present

## 2022-01-14 DIAGNOSIS — M25512 Pain in left shoulder: Secondary | ICD-10-CM

## 2022-01-14 DIAGNOSIS — F1010 Alcohol abuse, uncomplicated: Secondary | ICD-10-CM

## 2022-01-14 NOTE — Patient Instructions (Signed)
Back Exercises The following exercises strengthen the muscles that help to support the trunk (torso) and back. They also help to keep the lower back flexible. Doing these exercises can help to prevent or lessen existing low back pain. If you have back pain or discomfort, try doing these exercises 2-3 times each day or as told by your health care provider. As your pain improves, do them once each day, but increase the number of times that you repeat the steps for each exercise (do more repetitions). To prevent the recurrence of back pain, continue to do these exercises once each day or as told by your health care provider. Do exercises exactly as told by your health care provider and adjust them as directed. It is normal to feel mild stretching, pulling, tightness, or discomfort as you do these exercises, but you should stop right away if you feel sudden pain or your pain gets worse. Exercises Single knee to chest Repeat these steps 3-5 times for each leg: Lie on your back on a firm bed or the floor with your legs extended. Bring one knee to your chest. Your other leg should stay extended and in contact with the floor. Hold your knee in place by grabbing your knee or thigh with both hands and hold. Pull on your knee until you feel a gentle stretch in your lower back or buttocks. Hold the stretch for 10-30 seconds. Slowly release and straighten your leg.  Pelvic tilt Repeat these steps 5-10 times: Lie on your back on a firm bed or the floor with your legs extended. Bend your knees so they are pointing toward the ceiling and your feet are flat on the floor. Tighten your lower abdominal muscles to press your lower back against the floor. This motion will tilt your pelvis so your tailbone points up toward the ceiling instead of pointing to your feet or the floor. With gentle tension and even breathing, hold this position for 5-10 seconds.  Cat-cow Repeat these steps until your lower back becomes  more flexible: Get into a hands-and-knees position on a firm bed or the floor. Keep your hands under your shoulders, and keep your knees under your hips. You may place padding under your knees for comfort. Let your head hang down toward your chest. Contract your abdominal muscles and point your tailbone toward the floor so your lower back becomes rounded like the back of a cat. Hold this position for 5 seconds. Slowly lift your head, let your abdominal muscles relax, and point your tailbone up toward the ceiling so your back forms a sagging arch like the back of a cow. Hold this position for 5 seconds.  Press-ups Repeat these steps 5-10 times: Lie on your abdomen (face-down) on a firm bed or the floor. Place your palms near your head, about shoulder-width apart. Keeping your back as relaxed as possible and keeping your hips on the floor, slowly straighten your arms to raise the top half of your body and lift your shoulders. Do not use your back muscles to raise your upper torso. You may adjust the placement of your hands to make yourself more comfortable. Hold this position for 5 seconds while you keep your back relaxed. Slowly return to lying flat on the floor.  Bridges Repeat these steps 10 times: Lie on your back on a firm bed or the floor. Bend your knees so they are pointing toward the ceiling and your feet are flat on the floor. Your arms should be flat  at your sides, next to your body. Tighten your buttocks muscles and lift your buttocks off the floor until your waist is at almost the same height as your knees. You should feel the muscles working in your buttocks and the back of your thighs. If you do not feel these muscles, slide your feet 1-2 inches (2.5-5 cm) farther away from your buttocks. Hold this position for 3-5 seconds. Slowly lower your hips to the starting position, and allow your buttocks muscles to relax completely. If this exercise is too easy, try doing it with your arms  crossed over your chest. Abdominal crunches Repeat these steps 5-10 times: Lie on your back on a firm bed or the floor with your legs extended. Bend your knees so they are pointing toward the ceiling and your feet are flat on the floor. Cross your arms over your chest. Tip your chin slightly toward your chest without bending your neck. Tighten your abdominal muscles and slowly raise your torso high enough to lift your shoulder blades a tiny bit off the floor. Avoid raising your torso higher than that because it can put too much stress on your lower back and does not help to strengthen your abdominal muscles. Slowly return to your starting position.  Back lifts Repeat these steps 5-10 times: Lie on your abdomen (face-down) with your arms at your sides, and rest your forehead on the floor. Tighten the muscles in your legs and your buttocks. Slowly lift your chest off the floor while you keep your hips pressed to the floor. Keep the back of your head in line with the curve in your back. Your eyes should be looking at the floor. Hold this position for 3-5 seconds. Slowly return to your starting position.  Contact a health care provider if: Your back pain or discomfort gets much worse when you do an exercise. Your worsening back pain or discomfort does not lessen within 2 hours after you exercise. If you have any of these problems, stop doing these exercises right away. Do not do them again unless your health care provider says that you can. Get help right away if: You develop sudden, severe back pain. If this happens, stop doing the exercises right away. Do not do them again unless your health care provider says that you can. This information is not intended to replace advice given to you by your health care provider. Make sure you discuss any questions you have with your health care provider. Iliotibial Band Syndrome Rehab Ask your health care provider which exercises are safe for you. Do  exercises exactly as told by your health care provider and adjust them as directed. It is normal to feel mild stretching, pulling, tightness, or discomfort as you do these exercises. Stop right away if you feel sudden pain or your pain gets significantly worse. Do not begin these exercises until told by your health care provider. Stretching and range-of-motion exercises These exercises warm up your muscles and joints and improve the movement and flexibility of your hip and pelvis. Quadriceps stretch, prone  Lie on your abdomen (prone position) on a firm surface, such as a bed or padded floor. Bend your left / right knee and reach back to hold your ankle or pant leg. If you cannot reach your ankle or pant leg, loop a belt around your foot and grab the belt instead. Gently pull your heel toward your buttocks. Your knee should not slide out to the side. You should feel a stretch in  the front of your thigh and knee (quadriceps). Hold this position for __________ seconds. Repeat __________ times. Complete this exercise __________ times a day. Iliotibial band stretch An iliotibial band is a strong band of muscle tissue that runs from the outer side of your hip to the outer side of your thigh and knee. Lie on your side with your left / right leg in the top position. Bend both of your knees and grab your left / right ankle. Stretch out your bottom arm to help you balance. Slowly bring your top knee back so your thigh goes behind your trunk. Slowly lower your top leg toward the floor until you feel a gentle stretch on the outside of your left / right hip and thigh. If you do not feel a stretch and your knee will not fall farther, place the heel of your other foot on top of your knee and pull your knee down toward the floor with your foot. Hold this position for __________ seconds. Repeat __________ times. Complete this exercise __________ times a day. Strengthening exercises These exercises build strength  and endurance in your hip and pelvis. Endurance is the ability to use your muscles for a long time, even after they get tired. Straight leg raises, side-lying This exercise strengthens the muscles that rotate the leg at the hip and move it away from your body (hip abductors). Lie on your side with your left / right leg in the top position. Lie so your head, shoulder, hip, and knee line up. You may bend your bottom knee to help you balance. Roll your hips slightly forward so your hips are stacked directly over each other and your left / right knee is facing forward. Tense the muscles in your outer thigh and lift your top leg 4-6 inches (10-15 cm). Hold this position for __________ seconds. Slowly lower your leg to return to the starting position. Let your muscles relax completely before doing another repetition. Repeat __________ times. Complete this exercise __________ times a day. Leg raises, prone This exercise strengthens the muscles that move the hips backward (hip extensors). Lie on your abdomen (prone position) on your bed or a firm surface. You can put a pillow under your hips if that is more comfortable for your lower back. Bend your left / right knee so your foot is straight up in the air. Squeeze your buttocks muscles and lift your left / right thigh off the bed. Do not let your back arch. Tense your thigh muscle as hard as you can without increasing any knee pain. Hold this position for __________ seconds. Slowly lower your leg to return to the starting position and allow it to relax completely. Repeat __________ times. Complete this exercise __________ times a day. Hip hike Stand sideways on a bottom step. Stand on your left / right leg with your other foot unsupported next to the step. You can hold on to a railing or wall for balance if needed. Keep your knees straight and your torso square. Then lift your left / right hip up toward the ceiling. Slowly let your left / right hip  lower toward the floor, past the starting position. Your foot should get closer to the floor. Do not lean or bend your knees. Repeat __________ times. Complete this exercise __________ times a day. This information is not intended to replace advice given to you by your health care provider. Make sure you discuss any questions you have with your health care provider. Document Revised: 02/13/2020  Document Reviewed: 02/13/2020 [employer] Patient Education  2022 [employer] Back Exercises The following exercises strengthen the muscles that help to support the trunk (torso) and back. They also help to keep the lower back flexible. Doing these exercises can help to prevent or lessen existing low back pain. If you have back pain or discomfort, try doing these exercises 2-3 times each day or as told by your health care provider. As your pain improves, do them once each day, but increase the number of times that you repeat the steps for each exercise (do more repetitions). To prevent the recurrence of back pain, continue to do these exercises once each day or as told by your health care provider. Do exercises exactly as told by your health care provider and adjust them as directed. It is normal to feel mild stretching, pulling, tightness, or discomfort as you do these exercises, but you should stop right away if you feel sudden pain or your pain gets worse. Exercises Single knee to chest Repeat these steps 3-5 times for each leg: Lie on your back on a firm bed or the floor with your legs extended. Bring one knee to your chest. Your other leg should stay extended and in contact with the floor. Hold your knee in place by grabbing your knee or thigh with both hands and hold. Pull on your knee until you feel a gentle stretch in your lower back or buttocks. Hold the stretch for 10-30 seconds. Slowly release and straighten your leg.  Pelvic tilt Repeat these steps 5-10 times: Lie on your back on a firm bed  or the floor with your legs extended. Bend your knees so they are pointing toward the ceiling and your feet are flat on the floor. Tighten your lower abdominal muscles to press your lower back against the floor. This motion will tilt your pelvis so your tailbone points up toward the ceiling instead of pointing to your feet or the floor. With gentle tension and even breathing, hold this position for 5-10 seconds.  Cat-cow Repeat these steps until your lower back becomes more flexible: Get into a hands-and-knees position on a firm bed or the floor. Keep your hands under your shoulders, and keep your knees under your hips. You may place padding under your knees for comfort. Let your head hang down toward your chest. Contract your abdominal muscles and point your tailbone toward the floor so your lower back becomes rounded like the back of a cat. Hold this position for 5 seconds. Slowly lift your head, let your abdominal muscles relax, and point your tailbone up toward the ceiling so your back forms a sagging arch like the back of a cow. Hold this position for 5 seconds.  Press-ups Repeat these steps 5-10 times: Lie on your abdomen (face-down) on a firm bed or the floor. Place your palms near your head, about shoulder-width apart. Keeping your back as relaxed as possible and keeping your hips on the floor, slowly straighten your arms to raise the top half of your body and lift your shoulders. Do not use your back muscles to raise your upper torso. You may adjust the placement of your hands to make yourself more comfortable. Hold this position for 5 seconds while you keep your back relaxed. Slowly return to lying flat on the floor.  Bridges Repeat these steps 10 times: Lie on your back on a firm bed or the floor. Bend your knees so they are pointing toward the ceiling and your feet  are flat on the floor. Your arms should be flat at your sides, next to your body. Tighten your buttocks muscles and  lift your buttocks off the floor until your waist is at almost the same height as your knees. You should feel the muscles working in your buttocks and the back of your thighs. If you do not feel these muscles, slide your feet 1-2 inches (2.5-5 cm) farther away from your buttocks. Hold this position for 3-5 seconds. Slowly lower your hips to the starting position, and allow your buttocks muscles to relax completely. If this exercise is too easy, try doing it with your arms crossed over your chest. Abdominal crunches Repeat these steps 5-10 times: Lie on your back on a firm bed or the floor with your legs extended. Bend your knees so they are pointing toward the ceiling and your feet are flat on the floor. Cross your arms over your chest. Tip your chin slightly toward your chest without bending your neck. Tighten your abdominal muscles and slowly raise your torso high enough to lift your shoulder blades a tiny bit off the floor. Avoid raising your torso higher than that because it can put too much stress on your lower back and does not help to strengthen your abdominal muscles. Slowly return to your starting position.  Back lifts Repeat these steps 5-10 times: Lie on your abdomen (face-down) with your arms at your sides, and rest your forehead on the floor. Tighten the muscles in your legs and your buttocks. Slowly lift your chest off the floor while you keep your hips pressed to the floor. Keep the back of your head in line with the curve in your back. Your eyes should be looking at the floor. Hold this position for 3-5 seconds. Slowly return to your starting position.  Contact a health care provider if: Your back pain or discomfort gets much worse when you do an exercise. Your worsening back pain or discomfort does not lessen within 2 hours after you exercise. If you have any of these problems, stop doing these exercises right away. Do not do them again unless your health care provider says  that you can. Get help right away if: You develop sudden, severe back pain. If this happens, stop doing the exercises right away. Do not do them again unless your health care provider says that you can. This information is not intended to replace advice given to you by your health care provider. Make sure you discuss any questions you have with your health care provider. Document Revised: 06/02/2021 Document Reviewed: 02/18/2021 [employer] Patient Education  2022 [employer] Shoulder Exercises Ask your health care provider which exercises are safe for you. Do exercises exactly as told by your health care provider and adjust them as directed. It is normal to feel mild stretching, pulling, tightness, or discomfort as you do these exercises. Stop right away if you feel sudden pain or your pain gets worse. Do not begin these exercises until told by your health care provider. Stretching exercises External rotation and abduction This exercise is sometimes called corner stretch. This exercise rotates your arm outward (external rotation) and moves your arm out from your body (abduction). Stand in a doorway with one of your feet slightly in front of the other. This is called a staggered stance. If you cannot reach your forearms to the door frame, stand facing a corner of a room. Choose one of the following positions as told by your health care provider: Place  your hands and forearms on the door frame above your head. Place your hands and forearms on the door frame at the height of your head. Place your hands on the door frame at the height of your elbows. Slowly move your weight onto your front foot until you feel a stretch across your chest and in the front of your shoulders. Keep your head and chest upright and keep your abdominal muscles tight. Hold for __________ seconds. To release the stretch, shift your weight to your back foot. Repeat __________ times. Complete this exercise __________ times a  day. Extension, standing Stand and hold a broomstick, a cane, or a similar object behind your back. Your hands should be a little wider than shoulder width apart. Your palms should face away from your back. Keeping your elbows straight and your shoulder muscles relaxed, move the stick away from your body until you feel a stretch in your shoulders (extension). Avoid shrugging your shoulders while you move the stick. Keep your shoulder blades tucked down toward the middle of your back. Hold for __________ seconds. Slowly return to the starting position. Repeat __________ times. Complete this exercise __________ times a day. Range-of-motion exercises Pendulum  Stand near a wall or a surface that you can hold onto for balance. Bend at the waist and let your left / right arm hang straight down. Use your other arm to support you. Keep your back straight and do not lock your knees. Relax your left / right arm and shoulder muscles, and move your hips and your trunk so your left / right arm swings freely. Your arm should swing because of the motion of your body, not because you are using your arm or shoulder muscles. Keep moving your hips and trunk so your arm swings in the following directions, as told by your health care provider: Side to side. Forward and backward. In clockwise and counterclockwise circles. Continue each motion for __________ seconds, or for as long as told by your health care provider. Slowly return to the starting position. Repeat __________ times. Complete this exercise __________ times a day. Shoulder flexion, standing  Stand and hold a broomstick, a cane, or a similar object. Place your hands a little more than shoulder width apart on the object. Your left / right hand should be palm up, and your other hand should be palm down. Keep your elbow straight and your shoulder muscles relaxed. Push the stick up with your healthy arm to raise your left / right arm in front of your  body, and then over your head until you feel a stretch in your shoulder (flexion). Avoid shrugging your shoulder while you raise your arm. Keep your shoulder blade tucked down toward the middle of your back. Hold for __________ seconds. Slowly return to the starting position. Repeat __________ times. Complete this exercise __________ times a day. Shoulder abduction, standing Stand and hold a broomstick, a cane, or a similar object. Place your hands a little more than shoulder width apart on the object. Your left / right hand should be palm up, and your other hand should be palm down. Keep your elbow straight and your shoulder muscles relaxed. Push the object across your body toward your left / right side. Raise your left / right arm to the side of your body (abduction) until you feel a stretch in your shoulder. Do not raise your arm above shoulder height unless your health care provider tells you to do that. If directed, raise your arm over  your head. Avoid shrugging your shoulder while you raise your arm. Keep your shoulder blade tucked down toward the middle of your back. Hold for __________ seconds. Slowly return to the starting position. Repeat __________ times. Complete this exercise __________ times a day. Internal rotation  Place your left / right hand behind your back, palm up. Use your other hand to dangle an exercise band, a towel, or a similar object over your shoulder. Grasp the band with your left / right hand so you are holding on to both ends. Gently pull up on the band until you feel a stretch in the front of your left / right shoulder. The movement of your arm toward the center of your body is called internal rotation. Avoid shrugging your shoulder while you raise your arm. Keep your shoulder blade tucked down toward the middle of your back. Hold for __________ seconds. Release the stretch by letting go of the band and lowering your hands. Repeat __________ times. Complete this  exercise __________ times a day. Strengthening exercises External rotation  Sit in a stable chair without armrests. Secure an exercise band to a stable object at elbow height on your left / right side. Place a soft object, such as a folded towel or a small pillow, between your left / right upper arm and your body to move your elbow about 4 inches (10 cm) away from your side. Hold the end of the exercise band so it is tight and there is no slack. Keeping your elbow pressed against the soft object, slowly move your forearm out, away from your abdomen (external rotation). Keep your body steady so only your forearm moves. Hold for __________ seconds. Slowly return to the starting position. Repeat __________ times. Complete this exercise __________ times a day. Shoulder abduction  Sit in a stable chair without armrests, or stand up. Hold a __________ weight in your left / right hand, or hold an exercise band with both hands. Start with your arms straight down and your left / right palm facing in, toward your body. Slowly lift your left / right hand out to your side (abduction). Do not lift your hand above shoulder height unless your health care provider tells you that this is safe. Keep your arms straight. Avoid shrugging your shoulder while you do this movement. Keep your shoulder blade tucked down toward the middle of your back. Hold for __________ seconds. Slowly lower your arm, and return to the starting position. Repeat __________ times. Complete this exercise __________ times a day. Shoulder extension Sit in a stable chair without armrests, or stand up. Secure an exercise band to a stable object in front of you so it is at shoulder height. Hold one end of the exercise band in each hand. Your palms should face each other. Straighten your elbows and lift your hands up to shoulder height. Step back, away from the secured end of the exercise band, until the band is tight and there is no  slack. Squeeze your shoulder blades together as you pull your hands down to the sides of your thighs (extension). Stop when your hands are straight down by your sides. Do not let your hands go behind your body. Hold for __________ seconds. Slowly return to the starting position. Repeat __________ times. Complete this exercise __________ times a day. Shoulder row Sit in a stable chair without armrests, or stand up. Secure an exercise band to a stable object in front of you so it is at waist height. Hold  one end of the exercise band in each hand. Position your palms so that your thumbs are facing the ceiling (neutral position). Bend each of your elbows to a 90-degree angle (right angle) and keep your upper arms at your sides. Step back until the band is tight and there is no slack. Slowly pull your elbows back behind you. Hold for __________ seconds. Slowly return to the starting position. Repeat __________ times. Complete this exercise __________ times a day. Shoulder press-ups  Sit in a stable chair that has armrests. Sit upright, with your feet flat on the floor. Put your hands on the armrests so your elbows are bent and your fingers are pointing forward. Your hands should be about even with the sides of your body. Push down on the armrests and use your arms to lift yourself off the chair. Straighten your elbows and lift yourself up as much as you comfortably can. Move your shoulder blades down, and avoid letting your shoulders move up toward your ears. Keep your feet on the ground. As you get stronger, your feet should support less of your body weight as you lift yourself up. Hold for __________ seconds. Slowly lower yourself back into the chair. Repeat __________ times. Complete this exercise __________ times a day. Wall push-ups  Stand so you are facing a stable wall. Your feet should be about one arm-length away from the wall. Lean forward and place your palms on the wall at shoulder  height. Keep your feet flat on the floor as you bend your elbows and lean forward toward the wall. Hold for __________ seconds. Straighten your elbows to push yourself back to the starting position. Repeat __________ times. Complete this exercise __________ times a day. This information is not intended to replace advice given to you by your health care provider. Make sure you discuss any questions you have with your health care provider. Document Revised: 03/30/2019 Document Reviewed: 01/05/2019 Elsevier Patient Education  2022 [employer] Inc. Hip Bursitis Hip bursitis is swelling of one or more fluid-filled sacs (bursae) in your hip joint. This condition can cause pain, and your symptoms may come and go over time. What are the causes? Repeated use of your hip muscles. Injury to the hip. Weak butt muscles. Bone spurs. Infection. In some cases, the cause may not be known. What increases the risk? You are more likely to develop this condition if: You had a past hip injury or hip surgery. You have a condition, such as arthritis, gout, diabetes, or thyroid disease. You have spine problems. You have one leg that is shorter than the other. You run a lot or do long-distance running. You play sports where there is a risk of injury or falling, such as football, martial arts, or skiing. What are the signs or symptoms? Symptoms may come and go, and they often include: Pain in the hip or groin area. Pain may get worse when you move your hip. Tenderness and swelling of the hip. In rare cases, the bursa may become infected. If this happens, you may get a fever, as well as have warmth and redness in the hip area. How is this treated? This condition is treated by: Resting your hip. Icing your hip. Wrapping the hip area with an elastic bandage (compression wrap). Keeping the hip raised. Other treatments may include medicine, draining fluid out of the bursa, or using crutches, a cane, or a walker.  Surgery may be needed, but this is rare. Long-term treatment may include doing exercises to  help your strength and flexibility. It may also include lifestyle changes like losing weight to lessen the strain on your hip. Follow these instructions at home: Managing pain, stiffness, and swelling   If told, put ice on the painful area. Put ice in a plastic bag. Place a towel between your skin and the bag. Leave the ice on for 20 minutes, 2-3 times a day. Raise your hip by putting a pillow under your hips while you lie down. Stop if you feel pain. If told, put heat on the affected area. Do this as often as told by your doctor. Use a moist heat pack or a heating pad as told by your doctor. Place a towel between your skin and the heat source. Leave the heat on for 20-30 minutes. Take off the heat if your skin turns bright red. This is very important if you are unable to feel pain, heat, or cold. You may have a greater risk of getting burned. Activity Do not use your hip to support your body weight until your doctor says that you can. Use crutches, a cane, or a walker as told by your doctor. If the affected leg is one that you use to drive, ask your doctor if it is safe to drive. Rest and protect your hip as much as you can until you feel better. Return to your normal activities as told by your doctor. Ask your doctor what activities are safe for you. Do exercises as told by your doctor. General instructions Take over-the-counter and prescription medicines only as told by your doctor. Gently rub and stretch your injured area as often as is comfortable. Wear elastic bandages only as told by your doctor. If one of your legs is shorter than the other, get fitted for a shoe insert or orthotic. Keep a healthy weight. Follow instructions from your doctor. Keep all follow-up visits as told by your doctor. This is important. How is this prevented? Exercise regularly, as told by your doctor. Wear the  right shoes for the sport you play. Warm up and stretch before being active. Cool down and stretch after being active. Take breaks often from repeated activity. Avoid activities that bother your hip or cause pain. Avoid sitting down for a long time. Where to find more information American Academy of [employer]opa[employer]: orthoinfo.aaos.org Contact a doctor if: You have a fever. You have new symptoms. You have trouble walking or doing everyday activities. You have pain that gets worse or does not get better with medicine. Your skin around your hip is red. You get a feeling of warmth in your hip area. Get help right away if: You cannot move your hip. You have very bad pain. You cannot control the muscles in your feet. Summary Hip bursitis is swelling of one or more fluid-filled sacs (bursae) in your hip joint. Symptoms often come and go over time. This condition is often treated by resting and icing the hip. It also may help to keep the area raised and wrapped in an elastic bandage. Other treatments may be needed. This information is not intended to replace advice given to you by your health care provider. Make sure you discuss any questions you have with your health care provider. Document Revised: 10/08/2019 Document Reviewed: 08/14/2018 Elsevier Patient Education  2022 [employer].

## 2022-01-19 DIAGNOSIS — R69 Illness, unspecified: Secondary | ICD-10-CM | POA: Diagnosis not present

## 2022-01-21 ENCOUNTER — Ambulatory Visit: Payer: Medicare Other | Admitting: Rheumatology

## 2022-01-25 ENCOUNTER — Other Ambulatory Visit: Payer: Self-pay

## 2022-01-25 ENCOUNTER — Ambulatory Visit (INDEPENDENT_AMBULATORY_CARE_PROVIDER_SITE_OTHER): Payer: Medicare Other | Admitting: Family Medicine

## 2022-01-25 ENCOUNTER — Ambulatory Visit (INDEPENDENT_AMBULATORY_CARE_PROVIDER_SITE_OTHER): Payer: Medicare Other

## 2022-01-25 ENCOUNTER — Encounter: Payer: Self-pay | Admitting: Family Medicine

## 2022-01-25 VITALS — BP 158/71 | HR 98 | Ht 65.0 in | Wt 167.0 lb

## 2022-01-25 DIAGNOSIS — G8929 Other chronic pain: Secondary | ICD-10-CM

## 2022-01-25 DIAGNOSIS — Z23 Encounter for immunization: Secondary | ICD-10-CM | POA: Diagnosis not present

## 2022-01-25 DIAGNOSIS — M79605 Pain in left leg: Secondary | ICD-10-CM | POA: Insufficient documentation

## 2022-01-25 DIAGNOSIS — G629 Polyneuropathy, unspecified: Secondary | ICD-10-CM

## 2022-01-25 DIAGNOSIS — I10 Essential (primary) hypertension: Secondary | ICD-10-CM | POA: Diagnosis not present

## 2022-01-25 DIAGNOSIS — M5442 Lumbago with sciatica, left side: Secondary | ICD-10-CM

## 2022-01-25 NOTE — Assessment & Plan Note (Signed)
-  BP 161/84, on repeat 158/71,  likely secondary to pain and consistent movement during encounter  -continue losartan  -instructed to monitor BP at home and maintain BP log to bring to next visit  -follow up scheduled in 2 weeks for BP check, consider increasing losartan dose or possible starting amlodipine 5 mg if appropriate at follow up

## 2022-01-25 NOTE — Progress Notes (Signed)
° ° °  SUBJECTIVE:   CHIEF COMPLAINT / HPI:   Patient with history of type 2 diabetes presents with left leg pain that is worsened in the middle of the night or as soon as he gets up in the morning. Pain has been ongoing for the past 4-5 years, only occurs sometimes at night or first thing when he wakes up. Rates the pain 7/10, describes the pain as sharp. Pain radiates from his left hip and back down to his left leg and foot, worsened by walking. Denies any history of cancer, fever or chills. Endorsing occasional numbness and tingling along his leg. Endorses stiffness during these painful episodes. Denies groin paresthesia or dysuria. Patient follows up with a different provider, Dr. Posey Pronto, in [facility] for his DM management. Last A1c 9.1 in October 2022.   PERTINENT  PMH / PSH: Type 2 DM  OBJECTIVE:   BP (!) 158/71    Pulse 98    Ht 5\' 5"  (1.651 m)    Wt 167 lb (75.8 kg)    SpO2 100%    BMI 27.79 kg/m   General: Patient well-appearing, in no acute distress.  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 Posey Pronto for further DM management at earliest convenience to avoid further complications of DM such as neuropathy   Essential hypertension -BP 161/84, on repeat 158/71,  likely secondary to pain and consistent movement during encounter  -continue losartan  -instructed to monitor BP at home  and maintain BP log to bring to next visit  -follow up scheduled in 2 weeks for BP check, consider increasing losartan dose or possible starting amlodipine 5 mg if appropriate at follow up     Video Gujarati interpretation Astrid Divine [phone]) utilized throughout the entirety of this encounter.   Donney Dice, [place]

## 2022-01-25 NOTE — Assessment & Plan Note (Signed)
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 Allena Katz for further DM management at earliest convenience to avoid further complications of DM such as neuropathy

## 2022-01-25 NOTE — Patient Instructions (Addendum)
??? ???? ????? ??? ???? ???! ° °??? ??? ????? ???? ???????? ????? ???, ??? ???? ?? ?? ? ????? ???? ?????? ??????? ?? ????? ???????? ?????? ???????? ??????? ??. ???? ?? ????? ?????? ??? ??? ??? ?? ???????? ??? ???????????   300 ????????? ????? ?? ??? ?? ??? ??, ???? ????? ? ??? ??????? ?? ??? ????? ??? ? ??.  ??? ???? ??????? ????? ???? ?? ???????? ????? ??, ??? ???? ?? ?? ? ? ???????? ?????????? ?? ??? ????.  ?????????? ????????? ???? ???? ??? ??? ?? ???? ??? ???????? ????? ???? ???? ????? ????? ??????????? ??? ???? ???? ??????? ???? ???? ????? ???????? ?????? ????? ???.  ?????? ???? ?????? ????? ???????? ??, ???? ????? ????? ???? ?????? ??????? ??? ??? ???? ????? ????? ?????????? ????.  ???? ????? 2 ??????????? ????? ????? ????????? ???????????? ?? ?????? ???, ?? ????? ??? ??? ????? ????? ??????, ?? ???? ????? ????? ?????? ?????? ??????? ?????? ????.   ???? ????? ????? ??????? ??? ?????? ?????? ???? ??? ????!  ????, ??. ????? ?j? taman? j?'?n? kh?ba ?nanda thay?!  ?j? am? tam?r? pagan? dukh?v?n? carc? kar?, man? l?g? ch? k? ? ?n?ika r?t? sn?yu sambandhita ch? parantu m???bh?g? c?t?n? dukh?v?th? sambandhita ch?Lethea Killings? j? divas? dukh?v? vadhu that? h?ya t? divas?m?? tam? g?b?p?n?ina 300 miligr?ma darar?ja b? v?ra la'? ?ak? ch?, kr?p? kar?n? ? dav? divasam?? b? v?ra karat?? vadhu na l?.  M?? taman? ??r?rika upac?ra m??? pa?a sandarbhita kary? ch?, man? l?g? ch? k? ? ? lak?a??n? sudh?rav?m?? pa?a madada kara??.  Aniyantrita ??y?bi??sa ?nu? k?ra?a ban? ?ak? ch? t?th? vadhu lak?a??n? r?kav? m??? kr?p? kar?n? tam?r? ??y?bi??san? vadhu s?r? r?t? san?c?lita karav? m??? tam?r? ??k?aran? j?v?n? kh?tar? kar?.  Tam?ru? bla?a pr??ara th??u? ?liv????a ch?, kr?p? kar?n? darar?ja bla?a pr??ara r?k?r?a kar? an? t?n? tam?r? ?g?m? mul?k?tam?? l?v?.  Kr?p? kar?n? 2 a?hav??iy?m?? tam?r? ?g?m? suni?cita ?p?'in?am?n?a para ph?l?'apa kar?, j? hama??? an? pach? vacc? ka??pa?a udbhav?, t? kr?p? kar?n? am?r? ?phisan?  samparka karav?m?? acak??? nah??.   Aman? tam?r? tab?b? sambh??an? bh?ga banav?n? man?j?r? ?pav? badala ?bh?ra!  ?bh?ra, ??. G?nt?  It was great seeing you today!  Today we discussed your leg pain, I think this is partially muscle related but mostly related to nerve pain. You may take gabapentin 300 mg twice daily on days you are having worsening pain, please do not take this medication more than twice a day.   I have also referred you to physical therapy, I think this would help improve these symptoms as well.   Uncontrolled diabetes can cause this so please make sure to see your doctor to better manage your diabetes to prevent further symptoms.   Your blood pressure is a little elevated, please record blood pressures daily and bring this to your next visit.   Please follow up at your next scheduled appointment in 2 weeks, if anything arises between now and then, please don't hesitate to contact our office.   Thank you for allowing [place] to be a part of your medical care!  Thank you, Dr. Robyne Peers

## 2022-02-03 ENCOUNTER — Ambulatory Visit: Payer: Medicare Other | Admitting: Family Medicine

## 2022-02-09 ENCOUNTER — Ambulatory Visit: Payer: Medicare Other | Attending: Family Medicine

## 2022-02-09 ENCOUNTER — Other Ambulatory Visit: Payer: Self-pay

## 2022-02-09 DIAGNOSIS — M545 Low back pain, unspecified: Secondary | ICD-10-CM | POA: Diagnosis not present

## 2022-02-09 DIAGNOSIS — M5442 Lumbago with sciatica, left side: Secondary | ICD-10-CM | POA: Diagnosis not present

## 2022-02-09 DIAGNOSIS — G629 Polyneuropathy, unspecified: Secondary | ICD-10-CM | POA: Insufficient documentation

## 2022-02-09 DIAGNOSIS — G8929 Other chronic pain: Secondary | ICD-10-CM | POA: Diagnosis not present

## 2022-02-09 DIAGNOSIS — M6281 Muscle weakness (generalized): Secondary | ICD-10-CM | POA: Insufficient documentation

## 2022-02-09 NOTE — Therapy (Addendum)
OUTPATIENT PHYSICAL THERAPY THORACOLUMBAR EVALUATION   Patient Name: Mark Chase MRN: [id number] DOB:03/25/1951, 71 y.o., male Today's Date: 02/09/2022    02/09/22 1546  PT Visits / Re-Eval  Visit Number 1  Number of Visits 4  Date for PT Re-Evaluation 03/09/22  Authorization  Authorization Type [employer] MCR/MCD  Progress Note Due on Visit 4  PT Time Calculation  PT Start Time 1440  PT Stop Time 1530  PT Time Calculation (min) 50 min  PT - End of Session  Activity Tolerance Patient tolerated treatment well  Behavior During Therapy WFL for tasks assessed/performed    Past Medical History:  Diagnosis Date   Arthritis    Diabetes mellitus    Hyperlipidemia    Hypertension    Umbilical hernia    Past Surgical History:  Procedure Laterality Date   INSERTION OF MESH N/A 08/28/2015   Procedure: INSERTION OF MESH;  Surgeon: Mark AduEric Wilson, Chase;  Location: [facility];  Service: General;  Laterality: N/A;   SHOULDER SURGERY     Right   UMBILICAL HERNIA REPAIR N/A 08/28/2015   Procedure: OPEN REPAIR UMBILICAL HERNIA;  Surgeon: Mark AduEric Wilson, Chase;  Location: [facility];  Service: General;  Laterality: N/A;   Patient Active Problem List   Diagnosis Date Noted   Leg pain, left 01/25/2022   DDD (degenerative disc disease), cervical 01/14/2022   Primary osteoarthritis of both hands 01/14/2022   Alcohol use 06/03/2020   Alcohol use disorder, mild, abuse 05/14/2020   Chronic bilateral low back pain without sciatica 04/09/2020   Controlled diabetes mellitus type 2 with complications (HCC) 10/13/2019   Lumbar radiculopathy, chronic 10/13/2019   Hyperlipidemia 10/13/2019   Polyneuropathy associated with critical illness (HCC) 10/13/2019   Essential hypertension 10/13/2019   Blindness and low vision 10/13/2019   Chronic left shoulder pain 10/13/2019   Tobacco dipper 01/18/2012   Cardiovascular risk factor 01/18/2012    PCP: Mark Chase  REFERRING PROVIDER: Billey CoPray, Margaret E, Chase  REFERRING  DIAG: [phone],G89.29 (ICD-10-CM) - Chronic low back pain with left-sided sciatica, unspecified back pain laterality G62.9 (ICD-10-CM) - Neuropathy   THERAPY DIAG:  Muscle weakness (generalized)  Chronic low back pain, unspecified back pain laterality, unspecified whether sciatica present  ONSET DATE: Chronic in nature  SUBJECTIVE:                                                                                                                                                                                           SUBJECTIVE STATEMENT: Describes a history of chronic low back and B hip pain 10 years ongoing.  Has had previous OPPT with limited benefit.  Describes  increased symptoms with prolonged walking and riding his scooter over bumpy roads.  PERTINENT HISTORY:  Patient with history of type 2 diabetes presents with left leg pain that is worsened in the middle of the night or as soon as he gets up in the morning. Pain has been ongoing for the past 4-5 years, only occurs sometimes at night or first thing when he wakes up. Rates the pain 7/10, describes the pain as sharp. Pain radiates from his left hip and back down to his left leg and foot, worsened by walking. Denies any history of cancer, fever or chills. Endorsing occasional numbness and tingling along his leg. Endorses stiffness during these painful episodes. Denies groin paresthesia or dysuria. Patient follows up with a different provider, Mark Chase, in [facility] for his DM management. Last A1c 9.1 in October 2022.    PAIN:  Are you having pain? Yes NPRS scale: 6/10 Pain location: low back Pain orientation: Bilateral  PAIN TYPE: aching, burning, and tight Pain description: intermittent, burning, and dull  Aggravating factors: flexion and prolonged walking Relieving factors: rest and sitting  PRECAUTIONS: None  WEIGHT BEARING RESTRICTIONS No  FALLS:  Has patient fallen in last 6 months? No, Number of falls: 0  LIVING  ENVIRONMENT: Lives with: lives with their spouse Lives in: House/apartment  OCCUPATION: retired  PLOF: Independent  PATIENT GOALS To decrease and manage my low back pain   OBJECTIVE:   DIAGNOSTIC FINDINGS:  IMPRESSION:  1. Degenerative change of the lumbar spine without acute osseous  process.  2. Small L1-2 and L2-3 disc extrusions.  3. No canal stenosis. Neural foraminal narrowing L3-4 through L5-S1:  Moderate on the LEFT with soft tissue within the LEFT neural foramen  seen with synovial cyst or less likely disc extrusion.   Electronically Signed    By: Mark Chase M.D.    On: 01/11/2019 05:57    PATIENT SURVEYS:  Not assessed due to language barrier  SCREENING FOR RED FLAGS: Bowel or bladder incontinence: No  COGNITION:  Overall cognitive status: Within functional limits for tasks assessed     SENSATION:  Light touch: Appears intact  MUSCLE LENGTH: Hamstrings: Right 60 deg; Left 60 deg PKB negative for low back pain  POSTURE:  Decreased lumbar lordosis  PALPATION: Tender to distal paraspinals  LUMBARAROM/PROM  A/PROM A/PROM  02/09/2022  Flexion 25%  Extension 50%  Right lateral flexion 50%  Left lateral flexion 50%  Right rotation 50%  Left rotation 50%   (Blank rows = not tested)  LE AROM/PROM:  A/PROM Right 02/09/2022 Left 02/09/2022  Hip flexion * *  Hip extension * *  Hip abduction * *  Hip adduction    Hip internal rotation    Hip external rotation    Knee flexion * *  Knee extension * *  Ankle dorsiflexion * *  Ankle plantarflexion * *  Ankle inversion    Ankle eversion     Mark Capers)  LE MMT:  MMT Right 02/09/2022 Left 02/09/2022  Hip flexion 4+ 4+  Hip extension 4+ 4+  Hip abduction    Hip adduction    Hip internal rotation    Hip external rotation    Knee flexion 4+ 4+  Knee extension 4+ 4+  Ankle dorsiflexion 4+ 4+  Ankle plantarflexion 4+ 4+  Ankle inversion    Ankle eversion    Trunk flexion 3 3      LUMBAR SPECIAL TESTS:  Straight leg raise test: Negative, Slump  test: Negative, FABER test: Negative, and PKB negative  FUNCTIONAL TESTS:  5 times sit to stand: 9s  GAIT: Distance walked: 22ft x2 Assistive device utilized: None Level of assistance: Complete Independence Comments: flexed posture    TODAY'S TREATMENT  Eval and HEP   PATIENT EDUCATION:  Education details: Discussed eval findings, rehab rationale and POC and patient is in agreement  Person educated: Patient Education method: Explanation Education comprehension: verbalized understanding, returned demonstration, and needs further education   HOME EXERCISE PROGRAM: Access Code: [email or web address] URL: [email or web address] Date: 02/09/2022 Prepared by: Sharlynn Oliphant  Exercises Supine Bridge - 2 x daily - 7 x weekly - 2 sets - 15 reps Supine March - 2 x daily - 7 x weekly - 2 sets - 10 reps Supine Figure 4 Piriformis Stretch - 2 x daily - 7 x weekly - 1 sets - 3 reps - 30s hold Curl Up with Arms Crossed - 2 x daily - 7 x weekly - 2 sets - 15 reps   ASSESSMENT:  CLINICAL IMPRESSION: Patient is a 71 y.o. male who was seen today for physical therapy evaluation and treatment for chronic low pain.  He denies radiating symptoms or any numbness/tingling.  Lumbar ROM limited by pain and soft tissue restrictions.  LE strength is functional and 5x STS score is WNL.  Hip capsule tightness present and contributory to low back pain.   OBJECTIVE IMPAIRMENTS decreased knowledge of condition, decreased mobility, difficulty walking, decreased ROM, decreased strength, impaired flexibility, postural dysfunction, and pain.   ACTIVITY LIMITATIONS cleaning, community activity, laundry, yard work, and prolonged walking .   PERSONAL FACTORS Age, Behavior pattern, Fitness, Past/current experiences, and 1 comorbidity: DM  are also affecting patient's functional outcome.    REHAB POTENTIAL: Good  CLINICAL DECISION MAKING:  Stable/uncomplicated  EVALUATION COMPLEXITY: Low   GOALS: Goals reviewed with patient? Yes  SHORT TERM GOALS: STGs=LTGs  LONG TERM GOALS:   LTG Name Target Date Goal status  1 Patient to demonstrate independence in HEP Baseline: [email or web address] 03/09/2022 INITIAL  2 Increase lumbar flexion to 50% Baseline: 25% in standing 03/09/2022 INITIAL  3 Decrease pain to 4/10 with prolonged walking Baseline: 6/10 pain with prolonged walking 03/09/2022 INITIAL  4 Increase trunk flexion strength to 3+/5 Baseline: 3/5 03/09/2022 INITIAL  PLAN: PT FREQUENCY: 1x/week  PT DURATION: 4 weeks  PLANNED INTERVENTIONS: Therapeutic exercises, Therapeutic activity, Neuro Muscular re-education, Balance training, Gait training, Patient/Family education, Joint mobilization, and Stair training  PLAN FOR NEXT SESSION: review HEP, expand as appropriate, emphasize flexibility of hips and low back, core and abdominal strengthening   Lanice Shirts, PT 02/09/2022, 2:40 PM

## 2022-02-15 ENCOUNTER — Ambulatory Visit: Payer: Medicare Other

## 2022-02-15 ENCOUNTER — Other Ambulatory Visit: Payer: Self-pay

## 2022-02-15 DIAGNOSIS — G629 Polyneuropathy, unspecified: Secondary | ICD-10-CM | POA: Diagnosis not present

## 2022-02-15 DIAGNOSIS — G8929 Other chronic pain: Secondary | ICD-10-CM | POA: Diagnosis not present

## 2022-02-15 DIAGNOSIS — M6281 Muscle weakness (generalized): Secondary | ICD-10-CM

## 2022-02-15 DIAGNOSIS — M545 Low back pain, unspecified: Secondary | ICD-10-CM

## 2022-02-15 DIAGNOSIS — M5442 Lumbago with sciatica, left side: Secondary | ICD-10-CM | POA: Diagnosis not present

## 2022-02-15 NOTE — Therapy (Signed)
OUTPATIENT PHYSICAL THERAPY TREATMENT NOTE   Patient Name: Mark Chase MRN: [id number] DOB:October 16, 1951, 71 y.o., male Today's Date: 02/15/2022  PCP: Donney Dice, DO REFERRING PROVIDER: Donney Dice, DO   PT End of Session - 02/15/22 1124     Visit Number 2    Number of Visits 4    Date for PT Re-Evaluation 03/09/22    Authorization Type [employer] MCR/MCD    Progress Note Due on Visit 4    PT Start Time 1130    PT Stop Time 1215    PT Time Calculation (min) 45 min    Activity Tolerance Patient tolerated treatment well    Behavior During Therapy WFL for tasks assessed/performed             Past Medical History:  Diagnosis Date   Arthritis    Diabetes mellitus    Hyperlipidemia    Hypertension    Umbilical hernia    Past Surgical History:  Procedure Laterality Date   INSERTION OF MESH N/A 08/28/2015   Procedure: INSERTION OF MESH;  Surgeon: Greer Pickerel, MD;  Location: [facility];  Service: General;  Laterality: N/A;   SHOULDER SURGERY     Right   UMBILICAL HERNIA REPAIR N/A 08/28/2015   Procedure: OPEN REPAIR UMBILICAL HERNIA;  Surgeon: Greer Pickerel, MD;  Location: [facility];  Service: General;  Laterality: N/A;   Patient Active Problem List   Diagnosis Date Noted   Leg pain, left 01/25/2022   DDD (degenerative disc disease), cervical 01/14/2022   Primary osteoarthritis of both hands 01/14/2022   Alcohol use 06/03/2020   Alcohol use disorder, mild, abuse 05/14/2020   Chronic bilateral low back pain without sciatica 04/09/2020   Controlled diabetes mellitus type 2 with complications ([place]) [id number]   Lumbar radiculopathy, chronic 10/13/2019   Hyperlipidemia 10/13/2019   Polyneuropathy associated with critical illness ([place]) 10/13/2019   Essential hypertension 10/13/2019   Blindness and low vision 10/13/2019   Chronic left shoulder pain 10/13/2019   Tobacco dipper 01/18/2012   Cardiovascular risk factor 01/18/2012    REFERRING DIAG: M54.42,G89.29 (ICD-10-CM) - Chronic low  back pain with left-sided sciatica, unspecified back pain laterality G62.9 (ICD-10-CM) - Neuropathy   THERAPY DIAG: Chronic low back pain with left-sided sciatica   PERTINENT HISTORY: Patient with history of type 2 diabetes presents with left leg pain that is worsened in the middle of the night or as soon as he gets up in the morning. Pain has been ongoing for the past 4-5 years, only occurs sometimes at night or first thing when he wakes up. Rates the pain 7/10, describes the pain as sharp. Pain radiates from his left hip and back down to his left leg and foot, worsened by walking. Denies any history of cancer, fever or chills. Endorsing occasional numbness and tingling along his leg. Endorses stiffness during these painful episodes. Denies groin paresthesia or dysuria. Patient follows up with a different provider, Dr. Posey Pronto, in [facility] for his DM management. Last A1c 9.1 in October 2022.    PRECAUTIONS: none  SUBJECTIVE: Reports symptoms have markedly improved  PAIN:  Are you having pain? Yes NPRS scale: 2/10 Pain location: low back Pain orientation: Bilateral  PAIN TYPE: aching and burning Pain description: intermittent  Aggravating factors: standing, walking Relieving factors: sitting, rest     DIAGNOSTIC FINDINGS:  IMPRESSION:  1. Degenerative change of the lumbar spine without acute osseous  process.  2. Small L1-2 and L2-3 disc extrusions.  3. No canal stenosis. Neural  foraminal narrowing L3-4 through L5-S1:  Moderate on the LEFT with soft tissue within the LEFT neural foramen  seen with synovial cyst or less likely disc extrusion.   Electronically Signed    By: Elon Alas M.D.    On: 01/11/2019 05:57     PATIENT SURVEYS:  Not assessed due to language barrier   SCREENING FOR RED FLAGS: Bowel or bladder incontinence: No   COGNITION:          Overall cognitive status: Within functional limits for tasks assessed                        SENSATION:           Light touch: Appears intact   MUSCLE LENGTH: Hamstrings: Right 60 deg; Left 60 deg PKB negative for low back pain   POSTURE:  Decreased lumbar lordosis   PALPATION: Tender to distal paraspinals   LUMBARAROM/PROM   A/PROM A/PROM  02/09/2022  Flexion 25%  Extension 50%  Right lateral flexion 50%  Left lateral flexion 50%  Right rotation 50%  Left rotation 50%   (Blank rows = not tested)   LE AROM/PROM:   A/PROM Right 02/09/2022 Left 02/09/2022  Hip flexion * *  Hip extension * *  Hip abduction * *  Hip adduction      Hip internal rotation      Hip external rotation      Knee flexion * *  Knee extension * *  Ankle dorsiflexion * *  Ankle plantarflexion * *  Ankle inversion      Ankle eversion       [name]   LE MMT:   MMT Right 02/09/2022 Left 02/09/2022  Hip flexion 4+ 4+  Hip extension 4+ 4+  Hip abduction      Hip adduction      Hip internal rotation      Hip external rotation      Knee flexion 4+ 4+  Knee extension 4+ 4+  Ankle dorsiflexion 4+ 4+  Ankle plantarflexion 4+ 4+  Ankle inversion      Ankle eversion      Trunk flexion 3 3      LUMBAR SPECIAL TESTS:  Straight leg raise test: Negative, Slump test: Negative, FABER test: Negative, and PKB negative   FUNCTIONAL TESTS:  5 times sit to stand: 9s   GAIT: Distance walked: 25ft x2 Assistive device utilized: None Level of assistance: Complete Independence Comments: flexed posture       TODAY'S TREATMENT: [facility] Adult PT Treatment:                                                DATE: 02/15/22 Therapeutic Exercise: Nustep 8 min arms 8 seat 8 L4 B hip flexor stretch 30s x1, minimal tightness Bridge 15x2 Supine march B 15x Figure 4 piriformis stretch B 30s x3 Curl ups 15x2 SL ABD 15x2 B LTR 30s x2 B Manual Therapy: PA mobs L5-1 10x grade III each segment       PATIENT EDUCATION:  Education details: Discussed eval findings, rehab rationale and POC and patient is in agreement  Person  educated: Patient Education method: Explanation Education comprehension: verbalized understanding, returned demonstration, and needs further education     HOME EXERCISE PROGRAM: Access Code: [email or web address] URL: [email or web address] Date: 02/09/2022 Prepared by:  Sharlynn Oliphant   Exercises Supine Bridge - 2 x daily - 7 x weekly - 2 sets - 15 reps Supine March - 2 x daily - 7 x weekly - 2 sets - 10 reps Supine Figure 4 Piriformis Stretch - 2 x daily - 7 x weekly - 1 sets - 3 reps - 30s hold Curl Up with Arms Crossed - 2 x daily - 7 x weekly - 2 sets - 15 reps     ASSESSMENT:   CLINICAL IMPRESSION: Added aerobic work and reviewed HEP adding tasks as noted. Continued stretching/strengthening of posterior hips and core.  Minimal tightness with hip flexor stretching.  Some improvement in lumbar segmental mobility as noted with spring testing.    OBJECTIVE IMPAIRMENTS decreased knowledge of condition, decreased mobility, difficulty walking, decreased ROM, decreased strength, impaired flexibility, postural dysfunction, and pain.    ACTIVITY LIMITATIONS cleaning, community activity, laundry, yard work, and prolonged walking .    PERSONAL FACTORS Age, Behavior pattern, Fitness, Past/current experiences, and 1 comorbidity: DM  are also affecting patient's functional outcome.      REHAB POTENTIAL: Good   CLINICAL DECISION MAKING: Stable/uncomplicated   EVALUATION COMPLEXITY: Low     GOALS: Goals reviewed with patient? Yes   SHORT TERM GOALS: STGs=LTGs   LONG TERM GOALS:    LTG Name Target Date Goal status  1 Patient to demonstrate independence in HEP Baseline: [id number] 03/09/2022 INITIAL  2 Increase lumbar flexion to 50% Baseline: 25% in standing 03/09/2022 INITIAL  3 Decrease pain to 4/10 with prolonged walking Baseline: 6/10 pain with prolonged walking 03/09/2022 INITIAL  4 Increase trunk flexion strength to 3+/5 Baseline: 3/5 03/09/2022 INITIAL  PLAN: PT FREQUENCY:  1x/week   PT DURATION: 4 weeks   PLANNED INTERVENTIONS: Therapeutic exercises, Therapeutic activity, Neuro Muscular re-education, Balance training, Gait training, Patient/Family education, Joint mobilization, and Stair training   PLAN FOR NEXT SESSION: review HEP, expand as appropriate, emphasize flexibility of hips and low back, core and abdominal strengthening    Lanice Shirts, PT 02/15/2022, 11:25 AM

## 2022-02-22 ENCOUNTER — Other Ambulatory Visit: Payer: Self-pay

## 2022-02-22 ENCOUNTER — Ambulatory Visit: Payer: Medicare Other | Attending: Family Medicine

## 2022-02-22 DIAGNOSIS — G8929 Other chronic pain: Secondary | ICD-10-CM | POA: Diagnosis not present

## 2022-02-22 DIAGNOSIS — M6281 Muscle weakness (generalized): Secondary | ICD-10-CM | POA: Insufficient documentation

## 2022-02-22 DIAGNOSIS — M545 Low back pain, unspecified: Secondary | ICD-10-CM | POA: Diagnosis not present

## 2022-02-22 NOTE — Therapy (Signed)
OUTPATIENT PHYSICAL THERAPY TREATMENT NOTE   Patient Name: Mark Chase MRN: [id number] DOB:May 31, 1951, 71 y.o., male Today's Date: 02/22/2022  PCP: Mark Dice, DO REFERRING PROVIDER: Donney Dice, DO   PT End of Session - 02/22/22 1130     Visit Number 3    Number of Visits 4    Date for PT Re-Evaluation 03/09/22    Authorization Type [employer] MCR/MCD    Progress Note Due on Visit 4    PT Start Time 1130    PT Stop Time 1210    PT Time Calculation (min) 40 min    Activity Tolerance Patient tolerated treatment well    Behavior During Therapy WFL for tasks assessed/performed             Past Medical History:  Diagnosis Date   Arthritis    Diabetes mellitus    Hyperlipidemia    Hypertension    Umbilical hernia    Past Surgical History:  Procedure Laterality Date   INSERTION OF MESH N/A 08/28/2015   Procedure: INSERTION OF MESH;  Surgeon: Mark Pickerel, MD;  Location: [facility];  Service: General;  Laterality: N/A;   SHOULDER SURGERY     Right   UMBILICAL HERNIA REPAIR N/A 08/28/2015   Procedure: OPEN REPAIR UMBILICAL HERNIA;  Surgeon: Mark Pickerel, MD;  Location: [facility];  Service: General;  Laterality: N/A;   Patient Active Problem List   Diagnosis Date Noted   Leg pain, left 01/25/2022   DDD (degenerative disc disease), cervical 01/14/2022   Primary osteoarthritis of both hands 01/14/2022   Alcohol use 06/03/2020   Alcohol use disorder, mild, abuse 05/14/2020   Chronic bilateral low back pain without sciatica 04/09/2020   Controlled diabetes mellitus type 2 with complications ([place]) 08/67/6195   Lumbar radiculopathy, chronic 10/13/2019   Hyperlipidemia 10/13/2019   Polyneuropathy associated with critical illness ([place]) 10/13/2019   Essential hypertension 10/13/2019   Blindness and low vision 10/13/2019   Chronic left shoulder pain 10/13/2019   Tobacco dipper 01/18/2012   Cardiovascular risk factor 01/18/2012    REFERRING DIAG: M54.42,G89.29 (ICD-10-CM) - Chronic low  back pain with left-sided sciatica, unspecified back pain laterality G62.9 (ICD-10-CM) - Neuropathy   THERAPY DIAG: Chronic low back pain with left-sided sciatica   PERTINENT HISTORY: Patient with history of type 2 diabetes presents with left leg pain that is worsened in the middle of the night or as soon as he gets up in the morning. Pain has been ongoing for the past 4-5 years, only occurs sometimes at night or first thing when he wakes up. Rates the pain 7/10, describes the pain as sharp. Pain radiates from his left hip and back down to his left leg and foot, worsened by walking. Denies any history of cancer, fever or chills. Endorsing occasional numbness and tingling along his leg. Endorses stiffness during these painful episodes. Denies groin paresthesia or dysuria. Patient follows up with a different provider, Dr. Posey Chase, in [facility] for his DM management. Last A1c 9.1 in October 2022.    PRECAUTIONS: none  SUBJECTIVE: Reports no pain with short distance walking or ADLs, only experiences pain with prolonged walking which is improving.  Mild language barrier present.  PAIN:  Are you having pain? Yes NPRS scale: 2/10 Pain location: low back Pain orientation: Bilateral  PAIN TYPE: aching and burning Pain description: intermittent  Aggravating factors: standing, walking Relieving factors: sitting, rest     DIAGNOSTIC FINDINGS:  IMPRESSION:  1. Degenerative change of the lumbar spine without  acute osseous  process.  2. Small L1-2 and L2-3 disc extrusions.  3. No canal stenosis. Neural foraminal narrowing L3-4 through L5-S1:  Moderate on the LEFT with soft tissue within the LEFT neural foramen  seen with synovial cyst or less likely disc extrusion.   Electronically Signed    By: Mark Chase M.D.    On: 01/11/2019 05:57     PATIENT SURVEYS:  Not assessed due to language barrier   SCREENING FOR RED FLAGS: Bowel or bladder incontinence: No   COGNITION:           Overall cognitive status: Within functional limits for tasks assessed                        SENSATION:          Light touch: Appears intact   MUSCLE LENGTH: Hamstrings: Right 60 deg; Left 60 deg PKB negative for low back pain   POSTURE:  Decreased lumbar lordosis   PALPATION: Tender to distal paraspinals   LUMBARAROM/PROM   A/PROM A/PROM  02/09/2022  Flexion 25%  Extension 50%  Right lateral flexion 50%  Left lateral flexion 50%  Right rotation 50%  Left rotation 50%   (Blank rows = not tested)   LE AROM/PROM:   A/PROM Right 02/09/2022 Left 02/09/2022  Hip flexion * *  Hip extension * *  Hip abduction * *  Hip adduction      Hip internal rotation      Hip external rotation      Knee flexion * *  Knee extension * *  Ankle dorsiflexion * *  Ankle plantarflexion * *  Ankle inversion      Ankle eversion       Mark Chase)   LE MMT:   MMT Right 02/09/2022 Left 02/09/2022  Hip flexion 4+ 4+  Hip extension 4+ 4+  Hip abduction      Hip adduction      Hip internal rotation      Hip external rotation      Knee flexion 4+ 4+  Knee extension 4+ 4+  Ankle dorsiflexion 4+ 4+  Ankle plantarflexion 4+ 4+  Ankle inversion      Ankle eversion      Trunk flexion 3 3      LUMBAR SPECIAL TESTS:  Straight leg raise test: Negative, Slump test: Negative, FABER test: Negative, and PKB negative   FUNCTIONAL TESTS:  5 times sit to stand: 9s   GAIT: Distance walked: 20f x2 Assistive device utilized: None Level of assistance: Complete Independence Comments: flexed posture       TODAY'S TREATMENT:  [facility] Adult PT Treatment:                                                DATE: 02/22/22 Therapeutic Exercise: Nustep 8 min arms 8 seat 8 L4 Bridge with ball 15x2 Supine march B alternating 30x each, 2# Figure 4 piriformis stretch B 30s x3 Curl ups 15x2 B hip fallouts, RTB 30x LTR 30s x3 B SLR B 15x 2# Manual Therapy: PA mobs to L5-1, 10x each, grade III Prone press  with PT OP 10x   [facility] Adult PT Treatment:  DATE: 02/15/22 Therapeutic Exercise: Nustep 8 min arms 8 seat 8 L4 B hip flexor stretch 30s x1, minimal tightness Bridge 15x2 Supine march B 15x Figure 4 piriformis stretch B 30s x3 Curl ups 15x2 SL ABD 15x2 B LTR 30s x2 B  SLR Manual Therapy: PA mobs L5-1 10x grade III each segment       PATIENT EDUCATION:  Education details: Discussed eval findings, rehab rationale and POC and patient is in agreement  Person educated: Patient Education method: Explanation Education comprehension: verbalized understanding, returned demonstration, and needs further education     HOME EXERCISE PROGRAM: Access Code: [email or web address] URL: [email or web address] Date: 02/09/2022 Prepared by: Sharlynn Oliphant   Exercises Supine Bridge - 2 x daily - 7 x weekly - 2 sets - 15 reps Supine March - 2 x daily - 7 x weekly - 2 sets - 10 reps Supine Figure 4 Piriformis Stretch - 2 x daily - 7 x weekly - 1 sets - 3 reps - 30s hold Curl Up with Arms Crossed - 2 x daily - 7 x weekly - 2 sets - 15 reps     ASSESSMENT:   CLINICAL IMPRESSION: L side painfree with exercises and stretching.  Now symptoms only present with prolonged walking and standing.  Added hip flexor strengthening as well as additional abductor strengthening tasks.    OBJECTIVE IMPAIRMENTS decreased knowledge of condition, decreased mobility, difficulty walking, decreased ROM, decreased strength, impaired flexibility, postural dysfunction, and pain.    ACTIVITY LIMITATIONS cleaning, community activity, laundry, yard work, and prolonged walking .    PERSONAL FACTORS Age, Behavior pattern, Fitness, Past/current experiences, and 1 comorbidity: DM  are also affecting patient's functional outcome.      REHAB POTENTIAL: Good   CLINICAL DECISION MAKING: Stable/uncomplicated   EVALUATION COMPLEXITY: Low     GOALS: Goals reviewed with patient?  Yes   SHORT TERM GOALS: STGs=LTGs   LONG TERM GOALS:    LTG Name Target Date Goal status  1 Patient to demonstrate independence in HEP Baseline: Q2HRXG7P; 02/22/22 Able to demo properly 03/09/2022 Met  2 Increase lumbar flexion to 50% Baseline: 25% in standing 03/09/2022 INITIAL  3 Decrease pain to 4/10 with prolonged walking Baseline: 6/10 pain with prolonged walking 03/09/2022 INITIAL  4 Increase trunk flexion strength to 3+/5 Baseline: 3/5 03/09/2022 INITIAL  PLAN: PT FREQUENCY: 1x/week   PT DURATION: 4 weeks   PLANNED INTERVENTIONS: Therapeutic exercises, Therapeutic activity, Neuro Muscular re-education, Balance training, Gait training, Patient/Family education, Joint mobilization, and Stair training   PLAN FOR NEXT SESSION: Assess for DC    Lanice Shirts, PT 02/22/2022, 11:30 AM

## 2022-03-01 ENCOUNTER — Ambulatory Visit: Payer: Medicare Other

## 2022-03-02 ENCOUNTER — Telehealth: Payer: Self-pay

## 2022-03-02 NOTE — Telephone Encounter (Signed)
TC to patient regarding missed visit on 03/01/22, spoke to patient, he thought he was discharged, informed him he had one more visit if he felt he needed it, he will call clinic and schedule final visit. ?

## 2022-03-09 ENCOUNTER — Ambulatory Visit: Payer: Medicare Other

## 2022-03-17 ENCOUNTER — Ambulatory Visit: Payer: Medicare Other

## 2022-03-17 DIAGNOSIS — M6281 Muscle weakness (generalized): Secondary | ICD-10-CM | POA: Diagnosis not present

## 2022-03-17 DIAGNOSIS — G8929 Other chronic pain: Secondary | ICD-10-CM | POA: Diagnosis not present

## 2022-03-17 DIAGNOSIS — M545 Low back pain, unspecified: Secondary | ICD-10-CM | POA: Diagnosis not present

## 2022-03-17 NOTE — Therapy (Addendum)
?OUTPATIENT PHYSICAL THERAPY TREATMENT NOTE/DC SUMMARY ? ? ?Patient Name: Mark Chase ?MRN: [id number] ?DOB:January 12, 1951, 71 y.o., male ?Today's Date: 03/17/2022 ? ?PCP: Mark Dice, DO ?REFERRING PROVIDER: Donney Dice, DO ? ?PHYSICAL THERAPY DISCHARGE SUMMARY ? ?Visits from Start of Care: 4 ? ?Current functional level related to goals / functional outcomes: ?All goals met ?  ?Remaining deficits: ?None noted ?  ?Education / Equipment: ?HEP  ? ?Patient agrees to discharge. Patient goals were met. Patient is being discharged due to being pleased with the current functional level.  ? ? PT End of Session - 03/17/22 1304   ? ? Visit Number 4   ? Number of Visits 4   ? Date for PT Re-Evaluation 03/09/22   ? Authorization Type [employer] MCR/MCD   ? Progress Note Due on Visit 4   ? PT Start Time 1300   ? PT Stop Time 1340   ? PT Time Calculation (min) 40 min   ? Activity Tolerance Patient tolerated treatment well   ? Behavior During Therapy [facility] for tasks assessed/performed   ? ?  ?  ? ?  ? ? ?Past Medical History:  ?Diagnosis Date  ? Arthritis   ? Diabetes mellitus   ? Hyperlipidemia   ? Hypertension   ? Umbilical hernia   ? ?Past Surgical History:  ?Procedure Laterality Date  ? INSERTION OF MESH N/A 08/28/2015  ? Procedure: INSERTION OF MESH;  Surgeon: Mark Pickerel, MD;  Location: [facility];  Service: General;  Laterality: N/A;  ? SHOULDER SURGERY    ? Right  ? UMBILICAL HERNIA REPAIR N/A 08/28/2015  ? Procedure: OPEN REPAIR UMBILICAL HERNIA;  Surgeon: Mark Pickerel, MD;  Location: [facility];  Service: General;  Laterality: N/A;  ? ?Patient Active Problem List  ? Diagnosis Date Noted  ? Leg pain, left 01/25/2022  ? DDD (degenerative disc disease), cervical 01/14/2022  ? Primary osteoarthritis of both hands 01/14/2022  ? Alcohol use 06/03/2020  ? Alcohol use disorder, mild, abuse 05/14/2020  ? Chronic bilateral low back pain without sciatica 04/09/2020  ? Controlled diabetes mellitus type 2 with complications ([place]) 75/17/0017  ? Lumbar  radiculopathy, chronic 10/13/2019  ? Hyperlipidemia 10/13/2019  ? Polyneuropathy associated with critical illness ([place]) 10/13/2019  ? Essential hypertension 10/13/2019  ? Blindness and low vision 10/13/2019  ? Chronic left shoulder pain 10/13/2019  ? Tobacco dipper 01/18/2012  ? Cardiovascular risk factor 01/18/2012  ? ? ?REFERRING DIAG: M54.42,G89.29 (ICD-10-CM) - Chronic low back pain with left-sided sciatica, unspecified back pain laterality G62.9 (ICD-10-CM) - Neuropathy  ? ?THERAPY DIAG: Chronic low back pain with left-sided sciatica ? ? ?PERTINENT HISTORY: Patient with history of type 2 diabetes presents with left leg pain that is worsened in the middle of the night or as soon as he gets up in the morning. Pain has been ongoing for the past 4-5 years, only occurs sometimes at night or first thing when he wakes up. Rates the pain 7/10, describes the pain as sharp. Pain radiates from his left hip and back down to his left leg and foot, worsened by walking. Denies any history of cancer, fever or chills. Endorsing occasional numbness and tingling along his leg. Endorses stiffness during these painful episodes. Denies groin paresthesia or dysuria. Patient follows up with a different provider, Dr. Posey Chase, in [facility] for his DM management. Last A1c 9.1 in October 2022.   ? ?PRECAUTIONS: none ? ?SUBJECTIVE: No pain to report, functioning well and is ready for self management ? ?PAIN:  ?  Are you having pain? Yes ?NPRS scale: 2/10 ?Pain location: low back ?Pain orientation: Bilateral  ?PAIN TYPE: aching and burning ?Pain description: intermittent  ?Aggravating factors: standing, walking ?Relieving factors: sitting, rest ? ? ? ? ?DIAGNOSTIC FINDINGS:  ?IMPRESSION:  ?1. Degenerative change of the lumbar spine without acute osseous  ?process.  ?2. Small L1-2 and L2-3 disc extrusions.  ?3. No canal stenosis. Neural foraminal narrowing L3-4 through L5-S1:  ?Moderate on the LEFT with soft tissue within the LEFT neural  foramen  ?seen with synovial cyst or less likely disc extrusion.  ? ?Electronically Signed  ?  By: Mark Chase M.D.  ?  On: 01/11/2019 05:57 ?  ?  ?PATIENT SURVEYS:  ?Not assessed due to language barrier ?  ?SCREENING FOR RED FLAGS: ?Bowel or bladder incontinence: No ?  ?COGNITION: ?         Overall cognitive status: Within functional limits for tasks assessed              ?          ?SENSATION: ?         Light touch: Appears intact ?  ?MUSCLE LENGTH: ?Hamstrings: Right 60 deg; Left 60 deg ?PKB negative for low back pain ?  ?POSTURE:  ?Decreased lumbar lordosis ?  ?PALPATION: ?Tender to distal paraspinals ?  ?LUMBARAROM/PROM ?  ?A/PROM A/PROM  ?02/09/2022  ?Flexion 25%  ?Extension 50%  ?Right lateral flexion 50%  ?Left lateral flexion 50%  ?Right rotation 50%  ?Left rotation 50%  ? (Blank rows = not tested) ?  ?LE AROM/PROM: ?  ?A/PROM Right ?02/09/2022 Left ?02/09/2022  ?Hip flexion * *  ?Hip extension * *  ?Hip abduction * *  ?Hip adduction      ?Hip internal rotation      ?Hip external rotation      ?Knee flexion * *  ?Knee extension * *  ?Ankle dorsiflexion * *  ?Ankle plantarflexion * *  ?Ankle inversion      ?Ankle eversion      ? Mark Chase) ?  ?LE MMT: ?  ?MMT Right ?02/09/2022 Left ?02/09/2022  ?Hip flexion 4+ 4+  ?Hip extension 4+ 4+  ?Hip abduction      ?Hip adduction      ?Hip internal rotation      ?Hip external rotation      ?Knee flexion 4+ 4+  ?Knee extension 4+ 4+  ?Ankle dorsiflexion 4+ 4+  ?Ankle plantarflexion 4+ 4+  ?Ankle inversion      ?Ankle eversion      ?Trunk flexion 3 3  ?  ?  ?LUMBAR SPECIAL TESTS:  ?Straight leg raise test: Negative, Slump test: Negative, FABER test: Negative, and PKB negative ?  ?FUNCTIONAL TESTS:  ?5 times sit to stand: 9s ?  ?GAIT: ?Distance walked: 7f x2 ?Assistive device utilized: None ?Level of assistance: Complete Independence ?Comments: flexed posture ?  ?  ?  ?[facility] TREAT[facility]                                                [facility]  [facility] ?Therapeutic Exercise: ?Nustep 8 min arms 8 seat 8 L4 ?Bridge with ball 15x2 ?Supine march B alternating 30x each, 2# ?Figure 4 piriformis stretch B 30s x3 ?Curl ups 15x2 ?B hip fallouts, RTB 30x ?LTR 30s x3 B ?  SLR B 15x2 2# ? ?[facility]:                                                DATE: 02/22/22 ?Therapeutic Exercise: ?Nustep 8 min arms 8 seat 8 L4 ?Bridge with ball 15x2 ?Supine march B alternating 30x each, 2# ?Figure 4 piriformis stretch B 30s x3 ?Curl ups 15x2 ?B hip fallouts, RTB 30x ?LTR 30s x3 B ?SLR B 15x 2# ?Manual Therapy: ?PA mobs to L5-1, 10x each, grade III ?Prone press with PT OP 10x ? ? ?[facility] Adult PT Treatment:                                                DATE: 02/15/22 ?Therapeutic Exercise: ?Nustep 8 min arms 8 seat 8 L4 ?B hip flexor stretch 30s x1, minimal tightness ?Bridge 15x2 ?Supine march B 15x ?Figure 4 piriformis stretch B 30s x3 ?Curl ups 15x2 ?SL ABD 15x2 B ?LTR 30s x2 B  ?SLR ?Manual Therapy: ?PA mobs L5-1 10x grade III each segment ? ? ?  ?  ?PATIENT EDUCATION:  ?Education details: Discussed eval findings, rehab rationale and POC and patient is in agreement  ?Person educated: Patient ?Education method: Explanation ?Education comprehension: verbalized understanding, returned demonstration, and needs further education ?  ?  ?HOME EXERCISE PROGRAM: ?Access Code: [email or web address] ?URL: [email or web address] ?Date: 02/09/2022 ?Prepared by: Sharlynn Oliphant ?  ?Exercises ?Supine Bridge - 2 x daily - 7 x weekly - 2 sets - 15 reps ?Supine March - 2 x daily - 7 x weekly - 2 sets - 10 reps ?Supine Figure 4 Piriformis Stretch - 2 x daily - 7 x weekly - 1 sets - 3 reps - 30s hold ?Curl Up with Arms Crossed - 2 x daily - 7 x weekly - 2 sets - 15 reps ?  ?  ?ASSESSMENT: ?  ?CLINICAL IMPRESSION: Rehab goals met, patient ready for independent management ? ?  ?OBJECTIVE IMPAIRMENTS decreased knowledge of condition, decreased mobility, difficulty walking, decreased ROM,  decreased strength, impaired flexibility, postural dysfunction, and pain.  ?  ?ACTIVITY LIMITATIONS cleaning, community activity, laundry, yard work, and prolonged walking .  ?  ?PERSONAL FACTORS Age, Behavior pattern, Fitne

## 2022-03-17 NOTE — Addendum Note (Signed)
Addended by: Hildred Laser on: 03/17/2022 01:47 PM ? ? Modules accepted: Orders ? ?

## 2022-03-23 DIAGNOSIS — E7849 Other hyperlipidemia: Secondary | ICD-10-CM | POA: Diagnosis not present

## 2022-03-23 DIAGNOSIS — E1142 Type 2 diabetes mellitus with diabetic polyneuropathy: Secondary | ICD-10-CM | POA: Diagnosis not present

## 2022-03-23 DIAGNOSIS — Z7984 Long term (current) use of oral hypoglycemic drugs: Secondary | ICD-10-CM | POA: Diagnosis not present

## 2022-03-23 DIAGNOSIS — E538 Deficiency of other specified B group vitamins: Secondary | ICD-10-CM | POA: Diagnosis not present

## 2022-03-23 DIAGNOSIS — E113553 Type 2 diabetes mellitus with stable proliferative diabetic retinopathy, bilateral: Secondary | ICD-10-CM | POA: Diagnosis not present

## 2022-03-23 DIAGNOSIS — I10 Essential (primary) hypertension: Secondary | ICD-10-CM | POA: Diagnosis not present

## 2022-04-26 ENCOUNTER — Ambulatory Visit: Payer: Medicare Other | Admitting: Family Medicine

## 2022-05-05 DIAGNOSIS — K648 Other hemorrhoids: Secondary | ICD-10-CM | POA: Diagnosis not present

## 2022-05-05 DIAGNOSIS — Z86010 Personal history of colonic polyps: Secondary | ICD-10-CM | POA: Diagnosis not present

## 2022-05-05 DIAGNOSIS — Z09 Encounter for follow-up examination after completed treatment for conditions other than malignant neoplasm: Secondary | ICD-10-CM | POA: Diagnosis not present

## 2022-05-20 DIAGNOSIS — H5410 Blindness, one eye, low vision other eye, unspecified eyes: Secondary | ICD-10-CM | POA: Diagnosis not present

## 2022-05-20 DIAGNOSIS — E113553 Type 2 diabetes mellitus with stable proliferative diabetic retinopathy, bilateral: Secondary | ICD-10-CM | POA: Diagnosis not present

## 2022-05-20 DIAGNOSIS — I10 Essential (primary) hypertension: Secondary | ICD-10-CM | POA: Diagnosis not present

## 2022-05-20 DIAGNOSIS — Z0000 Encounter for general adult medical examination without abnormal findings: Secondary | ICD-10-CM | POA: Diagnosis not present

## 2022-05-20 DIAGNOSIS — M5416 Radiculopathy, lumbar region: Secondary | ICD-10-CM | POA: Diagnosis not present

## 2022-05-20 DIAGNOSIS — G6281 Critical illness polyneuropathy: Secondary | ICD-10-CM | POA: Diagnosis not present

## 2022-06-23 DIAGNOSIS — H52203 Unspecified astigmatism, bilateral: Secondary | ICD-10-CM | POA: Diagnosis not present

## 2022-06-23 DIAGNOSIS — H26491 Other secondary cataract, right eye: Secondary | ICD-10-CM | POA: Diagnosis not present

## 2022-06-23 DIAGNOSIS — H31013 Macula scars of posterior pole (postinflammatory) (post-traumatic), bilateral: Secondary | ICD-10-CM | POA: Diagnosis not present

## 2022-06-23 DIAGNOSIS — Z961 Presence of intraocular lens: Secondary | ICD-10-CM | POA: Diagnosis not present

## 2022-06-23 DIAGNOSIS — H02834 Dermatochalasis of left upper eyelid: Secondary | ICD-10-CM | POA: Diagnosis not present

## 2022-06-23 DIAGNOSIS — H02831 Dermatochalasis of right upper eyelid: Secondary | ICD-10-CM | POA: Diagnosis not present

## 2022-06-23 DIAGNOSIS — H3589 Other specified retinal disorders: Secondary | ICD-10-CM | POA: Diagnosis not present

## 2022-06-23 DIAGNOSIS — E119 Type 2 diabetes mellitus without complications: Secondary | ICD-10-CM | POA: Diagnosis not present

## 2022-06-23 DIAGNOSIS — H524 Presbyopia: Secondary | ICD-10-CM | POA: Diagnosis not present

## 2022-06-23 DIAGNOSIS — Z7984 Long term (current) use of oral hypoglycemic drugs: Secondary | ICD-10-CM | POA: Diagnosis not present

## 2022-06-23 DIAGNOSIS — H5211 Myopia, right eye: Secondary | ICD-10-CM | POA: Diagnosis not present

## 2022-06-23 DIAGNOSIS — H5202 Hypermetropia, left eye: Secondary | ICD-10-CM | POA: Diagnosis not present

## 2022-06-24 DIAGNOSIS — I10 Essential (primary) hypertension: Secondary | ICD-10-CM | POA: Diagnosis not present

## 2022-06-24 DIAGNOSIS — E538 Deficiency of other specified B group vitamins: Secondary | ICD-10-CM | POA: Diagnosis not present

## 2022-06-24 DIAGNOSIS — E7849 Other hyperlipidemia: Secondary | ICD-10-CM | POA: Diagnosis not present

## 2022-06-24 DIAGNOSIS — E1142 Type 2 diabetes mellitus with diabetic polyneuropathy: Secondary | ICD-10-CM | POA: Diagnosis not present

## 2022-06-24 DIAGNOSIS — Z7984 Long term (current) use of oral hypoglycemic drugs: Secondary | ICD-10-CM | POA: Diagnosis not present

## 2022-06-24 DIAGNOSIS — E559 Vitamin D deficiency, unspecified: Secondary | ICD-10-CM | POA: Diagnosis not present

## 2022-06-28 DIAGNOSIS — I10 Essential (primary) hypertension: Secondary | ICD-10-CM | POA: Diagnosis not present

## 2022-06-28 DIAGNOSIS — E559 Vitamin D deficiency, unspecified: Secondary | ICD-10-CM | POA: Diagnosis not present

## 2022-06-28 DIAGNOSIS — E7849 Other hyperlipidemia: Secondary | ICD-10-CM | POA: Diagnosis not present

## 2022-06-28 DIAGNOSIS — E538 Deficiency of other specified B group vitamins: Secondary | ICD-10-CM | POA: Diagnosis not present

## 2022-06-28 DIAGNOSIS — E1142 Type 2 diabetes mellitus with diabetic polyneuropathy: Secondary | ICD-10-CM | POA: Diagnosis not present

## 2022-08-09 DIAGNOSIS — E1142 Type 2 diabetes mellitus with diabetic polyneuropathy: Secondary | ICD-10-CM | POA: Diagnosis not present

## 2022-09-16 DIAGNOSIS — G8929 Other chronic pain: Secondary | ICD-10-CM | POA: Diagnosis not present

## 2022-09-16 DIAGNOSIS — M25511 Pain in right shoulder: Secondary | ICD-10-CM | POA: Diagnosis not present

## 2022-09-16 DIAGNOSIS — H3552 Pigmentary retinal dystrophy: Secondary | ICD-10-CM | POA: Diagnosis not present

## 2022-09-16 DIAGNOSIS — Z789 Other specified health status: Secondary | ICD-10-CM | POA: Diagnosis not present

## 2022-09-16 DIAGNOSIS — R202 Paresthesia of skin: Secondary | ICD-10-CM | POA: Diagnosis not present

## 2022-09-21 DIAGNOSIS — N1831 Chronic kidney disease, stage 3a: Secondary | ICD-10-CM | POA: Diagnosis not present

## 2022-09-21 DIAGNOSIS — R911 Solitary pulmonary nodule: Secondary | ICD-10-CM | POA: Diagnosis not present

## 2022-09-21 DIAGNOSIS — H5440 Blindness, one eye, unspecified eye: Secondary | ICD-10-CM | POA: Diagnosis not present

## 2022-09-21 DIAGNOSIS — I10 Essential (primary) hypertension: Secondary | ICD-10-CM | POA: Diagnosis not present

## 2022-09-21 DIAGNOSIS — I700 Atherosclerosis of aorta: Secondary | ICD-10-CM | POA: Diagnosis not present

## 2022-09-21 DIAGNOSIS — D696 Thrombocytopenia, unspecified: Secondary | ICD-10-CM | POA: Diagnosis not present

## 2022-09-21 DIAGNOSIS — Z79899 Other long term (current) drug therapy: Secondary | ICD-10-CM | POA: Diagnosis not present

## 2022-09-21 DIAGNOSIS — Z1159 Encounter for screening for other viral diseases: Secondary | ICD-10-CM | POA: Diagnosis not present

## 2022-09-21 DIAGNOSIS — E1140 Type 2 diabetes mellitus with diabetic neuropathy, unspecified: Secondary | ICD-10-CM | POA: Diagnosis not present

## 2022-09-21 DIAGNOSIS — E1121 Type 2 diabetes mellitus with diabetic nephropathy: Secondary | ICD-10-CM | POA: Diagnosis not present

## 2022-09-24 DIAGNOSIS — E1142 Type 2 diabetes mellitus with diabetic polyneuropathy: Secondary | ICD-10-CM | POA: Diagnosis not present

## 2022-09-24 DIAGNOSIS — I10 Essential (primary) hypertension: Secondary | ICD-10-CM | POA: Diagnosis not present

## 2022-09-24 DIAGNOSIS — Z7985 Long-term (current) use of injectable non-insulin antidiabetic drugs: Secondary | ICD-10-CM | POA: Diagnosis not present

## 2022-09-24 DIAGNOSIS — E7849 Other hyperlipidemia: Secondary | ICD-10-CM | POA: Diagnosis not present

## 2022-09-24 DIAGNOSIS — Z7984 Long term (current) use of oral hypoglycemic drugs: Secondary | ICD-10-CM | POA: Diagnosis not present

## 2022-09-27 DIAGNOSIS — Z23 Encounter for immunization: Secondary | ICD-10-CM | POA: Diagnosis not present

## 2022-09-27 DIAGNOSIS — I700 Atherosclerosis of aorta: Secondary | ICD-10-CM | POA: Diagnosis not present

## 2022-09-27 DIAGNOSIS — I509 Heart failure, unspecified: Secondary | ICD-10-CM | POA: Diagnosis not present

## 2022-09-27 DIAGNOSIS — Z79899 Other long term (current) drug therapy: Secondary | ICD-10-CM | POA: Diagnosis not present

## 2022-09-27 DIAGNOSIS — I739 Peripheral vascular disease, unspecified: Secondary | ICD-10-CM | POA: Diagnosis not present

## 2022-09-27 DIAGNOSIS — H5440 Blindness, one eye, unspecified eye: Secondary | ICD-10-CM | POA: Diagnosis not present

## 2022-09-27 DIAGNOSIS — E1121 Type 2 diabetes mellitus with diabetic nephropathy: Secondary | ICD-10-CM | POA: Diagnosis not present

## 2022-09-27 DIAGNOSIS — E1140 Type 2 diabetes mellitus with diabetic neuropathy, unspecified: Secondary | ICD-10-CM | POA: Diagnosis not present

## 2022-09-27 DIAGNOSIS — I10 Essential (primary) hypertension: Secondary | ICD-10-CM | POA: Diagnosis not present

## 2022-09-27 DIAGNOSIS — D696 Thrombocytopenia, unspecified: Secondary | ICD-10-CM | POA: Diagnosis not present

## 2022-09-27 DIAGNOSIS — Z0001 Encounter for general adult medical examination with abnormal findings: Secondary | ICD-10-CM | POA: Diagnosis not present

## 2022-10-08 DIAGNOSIS — Z79899 Other long term (current) drug therapy: Secondary | ICD-10-CM | POA: Diagnosis not present

## 2022-11-02 ENCOUNTER — Other Ambulatory Visit (HOSPITAL_BASED_OUTPATIENT_CLINIC_OR_DEPARTMENT_OTHER): Payer: Self-pay

## 2022-11-02 MED ORDER — OZEMPIC (1 MG/DOSE) 4 MG/3ML ~~LOC~~ SOPN
1.0000 mg | PEN_INJECTOR | SUBCUTANEOUS | 1 refills | Status: AC
  Filled 2022-11-02 (×2): qty 3, 28d supply, fill #0

## 2022-12-30 ENCOUNTER — Other Ambulatory Visit: Payer: Self-pay | Admitting: Family Medicine

## 2022-12-30 DIAGNOSIS — R1011 Right upper quadrant pain: Secondary | ICD-10-CM

## 2023-01-05 ENCOUNTER — Ambulatory Visit
Admission: RE | Admit: 2023-01-05 | Discharge: 2023-01-05 | Disposition: A | Discharge status: Home / Self Care | Payer: 59 | Source: Ambulatory Visit | Attending: Family Medicine | Admitting: Family Medicine

## 2023-01-05 DIAGNOSIS — R1011 Right upper quadrant pain: Secondary | ICD-10-CM

## 2023-01-17 ENCOUNTER — Telehealth: Payer: Self-pay

## 2023-01-17 NOTE — Telephone Encounter (Signed)
error 

## 2023-01-31 ENCOUNTER — Other Ambulatory Visit: Payer: Self-pay | Admitting: *Deleted

## 2023-01-31 DIAGNOSIS — M79605 Pain in left leg: Secondary | ICD-10-CM

## 2023-02-06 NOTE — Progress Notes (Unsigned)
Office Note     CC: Sent for bilateral lower extremity claudication Requesting Provider:  Vladimir Crofts, MD  HPI: Mark Chase is a 72 y.o. (1951/08/30) male presenting at the request of .Donney Dice, DO concern for bilateral lower extremity claudication.  On exam today today, the patient was doing well.  Originally from [place], he immigrated to the [place] in 2007.  Now legally blind, he works at [employer] for the [employer].   He has noted bilateral lower extremity pain for a number of years that occurs after walking.  The right leg affects him more than his left leg.  States he can walk for roughly 1/4 mile there is severe pain in bilateral lower extremities.  Once he stops for 10 seconds, he can continue.  The pain in his lower extremities begins in his back, and radiates down the legs and into his calves.  Along with ambulation, he also feels the pain in the morning when he wakes up and places his legs on the ground.  Denies ischemic rest pain, denies tissue loss  The pt is  on a statin for cholesterol management.  The pt is  on a daily aspirin.   Other AC:  - The pt is not on medication for hypertension.   The pt is  diabetic.  Tobacco hx:  no  Past Medical History:  Diagnosis Date   Arthritis    Diabetes mellitus    Hyperlipidemia    Hypertension    Umbilical hernia     Past Surgical History:  Procedure Laterality Date   INSERTION OF MESH N/A 08/28/2015   Procedure: INSERTION OF MESH;  Surgeon: Greer Pickerel, MD;  [facility]: [facility];  Service: General;  Laterality: N/A;   SHOULDER SURGERY     Right   UMBILICAL HERNIA REPAIR N/A 08/28/2015   Procedure: OPEN REPAIR UMBILICAL HERNIA;  Surgeon: Greer Pickerel, MD;  Location: [facility];  Service: General;  Laterality: N/A;    Social History   Socioeconomic History   Marital status: Married    Spouse name: Not on file   Number of children: 2   Years of education: Not on file   Highest education level: Not on file  Occupational  History   Occupation: Works for [employer] of the [employer]  Tobacco Use   Smoking status: Never   Smokeless tobacco: Never  Vaping Use   Vaping Use: Never used  Substance and Sexual Activity   Alcohol use: Not Currently   Drug use: Never   Sexual activity: Not on file  Other Topics Concern   Not on file  Social History Narrative   Not on file   Social Determinants of Health   Financial Resource Strain: Not on file  Food Insecurity: Not on file  Transportation Needs: Not on file  Physical Activity: Not on file  Stress: Not on file  Social Connections: Not on file  Intimate Partner Violence: Not on file   Family History  Problem Relation Age of Onset   Hypertension Mother 66   Coronary artery disease Father 34    Current Outpatient Medications  Medication Sig Dispense Refill   aspirin 81 MG tablet Take 81 mg by mouth daily.     cyanocobalamin (,VITAMIN B-12,) 1000 MCG/ML injection Inject into the muscle.     diclofenac Sodium (VOLTAREN) 1 % GEL APPLY  4 GRAMS OF GEL TOPICALLY TO AFFECTED AREA 4 TIMES DAILY 100 g 0   DULoxetine (CYMBALTA) 20 MG capsule Take 1  capsule (20 mg total) by mouth 2 (two) times daily. 30 capsule 6   empagliflozin (JARDIANCE) 25 MG TABS tablet Take 1 tablet (25 mg total) by mouth daily. 90 tablet 3   fenofibrate (TRICOR) 145 MG tablet Take 1 tablet (145 mg total) by mouth daily. 90 tablet 3   gabapentin (NEURONTIN) 300 MG capsule Take 1 capsule (300 mg total) by mouth daily. 60 capsule 1   glucose blood test strip Use as instructed 100 each 1   losartan (COZAAR) 50 MG tablet Take 1 tablet (50 mg total) by mouth daily. 90 tablet 3   metFORMIN (GLUCOPHAGE) 1000 MG tablet Take 1 tablet (1,000 mg total) by mouth 2 (two) times daily with a meal. 180 tablet 3   naltrexone (DEPADE) 50 MG tablet Take 2 tablets (100 mg total) by mouth daily. 60 tablet 1   pravastatin (PRAVACHOL) 80 MG tablet Take 1 tablet (80 mg total) by mouth at bedtime. 90 tablet 3    Semaglutide, 1 MG/DOSE, (OZEMPIC, 1 MG/DOSE,) 4 MG/3ML SOPN Inject 1 mg into the skin once a week. 3 mL 1   thiamine 100 MG tablet Take 1 tablet (100 mg total) by mouth daily. (Patient not taking: Reported on 01/14/2022) 90 tablet 3   traZODone (DESYREL) 50 MG tablet Take 1 tablet (50 mg total) by mouth at bedtime. 30 tablet 0   No current facility-administered medications for this visit.    Allergies  Allergen Reactions   Lisinopril Cough     REVIEW OF SYSTEMS:  [X]$  denotes positive finding, [ ]$  denotes negative finding Cardiac  Comments:  Chest pain or chest pressure:    Shortness of breath upon exertion:    Short of breath when lying flat:    Irregular heart rhythm:        Vascular    Pain in calf, thigh, or hip brought on by ambulation: X   Pain in feet at night that wakes you up from your sleep:     Blood clot in your veins:    Leg swelling:         Pulmonary    Oxygen at home:    Productive cough:     Wheezing:         Neurologic    Sudden weakness in arms or legs:     Sudden numbness in arms or legs:     Sudden onset of difficulty speaking or slurred speech:    Temporary loss of vision in one eye:     Problems with dizziness:         Gastrointestinal    Blood in stool:     Vomited blood:         Genitourinary    Burning when urinating:     Blood in urine:        Psychiatric    Major depression:         Hematologic    Bleeding problems:    Problems with blood clotting too easily:        Skin    Rashes or ulcers:        Constitutional    Fever or chills:      PHYSICAL EXAMINATION:  There were no vitals filed for this visit.  General:  WDWN in NAD; vital signs documented above Gait: Not observed HENT: WNL, normocephalic Pulmonary: normal non-labored breathing , without wheezing Cardiac: regular HR Abdomen: soft, NT, no masses Skin: without rashes Vascular Exam/Pulses:  Right Left  Radial 2+ (  normal) 2+ (normal)  Ulnar    Femoral 2+  (normal) 2+ (normal)  Popliteal    DP 2+ (normal) 2+ (normal)  PT     Extremities: without ischemic changes, without Gangrene , without cellulitis; without open wounds;  Musculoskeletal: no muscle wasting or atrophy  Neurologic: A&O X 3;  No focal weakness or paresthesias are detected Psychiatric:  The pt has Normal affect.   Non-Invasive Vascular Imaging:   ABI Findings:  +---------+------------------+-----+---------+--------+  Right   Rt Pressure (mmHg)IndexWaveform Comment   +---------+------------------+-----+---------+--------+  Brachial 116                                       +---------+------------------+-----+---------+--------+  PTA     165               1.38 triphasic          +---------+------------------+-----+---------+--------+  DP      255               2.12 triphasic          +---------+------------------+-----+---------+--------+  Great Toe93                0.78                    +---------+------------------+-----+---------+--------+   +---------+------------------+-----+---------+-------+  Left    Lt Pressure (mmHg)IndexWaveform Comment  +---------+------------------+-----+---------+-------+  Brachial 120                                      +---------+------------------+-----+---------+-------+  PTA     255               2.12 triphasic         +---------+------------------+-----+---------+-------+  DP      255               2.12 triphasic         +---------+------------------+-----+---------+-------+  Doristine Devoid Toe89                0.74                   +---------+------------------+-----+---------+-------+     ASSESSMENT/PLAN: Deaunte Rubinstein is a 72 y.o. male presenting with bilateral lower extremities which radiates from his back, down bilateral legs.  On exam, he had full, palpable pulses bilaterally.  ABIs were noncompressible, however waveforms were reserved, with normal toe pressures.  I had  a long discussion with Nischal discussing the above.  While symptoms are similar to those appreciated in patients with claudication, Jawon has a normal vascular exam, including readily palpable femoral and pedal pulses.  I am concerned he may have a neurogenic etiology for his claudication.   He would benefit from spine surgery referral for further workup.   Mark John, MD [facility] [phone]

## 2023-02-07 ENCOUNTER — Ambulatory Visit (HOSPITAL_COMMUNITY)
Admission: RE | Admit: 2023-02-07 | Discharge: 2023-02-07 | Disposition: A | Discharge status: Home / Self Care | Payer: 59 | Source: Ambulatory Visit | Attending: Surgery | Admitting: Surgery

## 2023-02-07 ENCOUNTER — Encounter: Payer: Self-pay | Admitting: Vascular Surgery

## 2023-02-07 ENCOUNTER — Ambulatory Visit (INDEPENDENT_AMBULATORY_CARE_PROVIDER_SITE_OTHER): Payer: 59 | Admitting: Vascular Surgery

## 2023-02-07 VITALS — BP 117/70 | HR 79 | Temp 98.1°F | Resp 18 | Ht 66.0 in | Wt 163.0 lb

## 2023-02-07 DIAGNOSIS — M79605 Pain in left leg: Secondary | ICD-10-CM | POA: Insufficient documentation

## 2023-02-08 LAB — VAS US ABI WITH/WO TBI

## 2023-06-30 ENCOUNTER — Other Ambulatory Visit (HOSPITAL_BASED_OUTPATIENT_CLINIC_OR_DEPARTMENT_OTHER): Payer: Self-pay | Admitting: Family Medicine

## 2023-06-30 DIAGNOSIS — K118 Other diseases of salivary glands: Secondary | ICD-10-CM

## 2023-07-02 ENCOUNTER — Ambulatory Visit (HOSPITAL_BASED_OUTPATIENT_CLINIC_OR_DEPARTMENT_OTHER): Payer: 59

## 2023-11-03 ENCOUNTER — Ambulatory Visit
Admission: RE | Admit: 2023-11-03 | Discharge: 2023-11-03 | Disposition: A | Discharge status: Home / Self Care | Payer: 59 | Source: Ambulatory Visit | Attending: Family Medicine | Admitting: Family Medicine

## 2023-11-03 DIAGNOSIS — K118 Other diseases of salivary glands: Secondary | ICD-10-CM

## 2024-02-06 ENCOUNTER — Emergency Department (HOSPITAL_COMMUNITY): Payer: 59

## 2024-02-06 ENCOUNTER — Other Ambulatory Visit: Payer: Self-pay

## 2024-02-06 ENCOUNTER — Encounter (HOSPITAL_COMMUNITY): Payer: Self-pay

## 2024-02-06 ENCOUNTER — Emergency Department (HOSPITAL_COMMUNITY)
Admission: EM | Admit: 2024-02-06 | Discharge: 2024-02-06 | Disposition: A | Discharge status: Home / Self Care | Payer: 59 | Attending: Emergency Medicine | Admitting: Emergency Medicine

## 2024-02-06 DIAGNOSIS — Z7982 Long term (current) use of aspirin: Secondary | ICD-10-CM | POA: Diagnosis not present

## 2024-02-06 DIAGNOSIS — S069X1A Unspecified intracranial injury with loss of consciousness of 30 minutes or less, initial encounter: Secondary | ICD-10-CM | POA: Diagnosis present

## 2024-02-06 DIAGNOSIS — S80212A Abrasion, left knee, initial encounter: Secondary | ICD-10-CM | POA: Insufficient documentation

## 2024-02-06 DIAGNOSIS — R0781 Pleurodynia: Secondary | ICD-10-CM | POA: Diagnosis not present

## 2024-02-06 DIAGNOSIS — S8992XA Unspecified injury of left lower leg, initial encounter: Secondary | ICD-10-CM | POA: Diagnosis present

## 2024-02-06 DIAGNOSIS — Y92481 Parking lot as the place of occurrence of the external cause: Secondary | ICD-10-CM | POA: Diagnosis not present

## 2024-02-06 MED ORDER — ACETAMINOPHEN 325 MG PO TABS: 650.0000 mg | ORAL_TABLET | Freq: Four times a day (QID) | ORAL | 0 refills | Status: DC | PRN

## 2024-02-06 MED ORDER — ACETAMINOPHEN 325 MG PO TABS
650.0000 mg | ORAL_TABLET | Freq: Once | ORAL | Status: AC
  Administered 2024-02-06: 650 mg via ORAL
  Filled 2024-02-06: qty 2

## 2024-02-06 MED ORDER — IBUPROFEN 600 MG PO TABS: 600.0000 mg | ORAL_TABLET | Freq: Four times a day (QID) | ORAL | 0 refills | Status: AC | PRN

## 2024-02-06 MED ORDER — IBUPROFEN 400 MG PO TABS
600.0000 mg | ORAL_TABLET | Freq: Once | ORAL | Status: AC
  Administered 2024-02-06: 600 mg via ORAL
  Filled 2024-02-06: qty 1
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
## 2024-02-06 NOTE — ED Notes (Signed)
 Gauze applied to knee abrasion

## 2024-02-06 NOTE — ED Notes (Signed)
 CT scan delayed due to limited scanners open
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
## 2024-02-06 NOTE — ED Notes (Signed)
 Pt stood up and ambulated very well without assistance. No pain or dizziness reported by patient

## 2024-02-21 ENCOUNTER — Other Ambulatory Visit: Payer: Self-pay

## 2024-02-21 MED ORDER — TRIAMCINOLONE ACETONIDE 0.5 % EX OINT
TOPICAL_OINTMENT | Freq: Two times a day (BID) | CUTANEOUS | 1 refills | Status: AC
  Filled 2024-02-21: qty 45, 25d supply, fill #0

## 2024-02-23 ENCOUNTER — Other Ambulatory Visit: Payer: Self-pay

## 2024-06-26 ENCOUNTER — Ambulatory Visit: Admitting: Family Medicine

## 2024-06-29 ENCOUNTER — Ambulatory Visit: Admitting: Family Medicine

## 2024-07-12 ENCOUNTER — Ambulatory Visit (INDEPENDENT_AMBULATORY_CARE_PROVIDER_SITE_OTHER)

## 2024-07-12 VITALS — BP 141/66 | HR 80 | Wt 155.4 lb

## 2024-07-12 DIAGNOSIS — M1612 Unilateral primary osteoarthritis, left hip: Secondary | ICD-10-CM | POA: Diagnosis not present

## 2024-07-12 DIAGNOSIS — G8929 Other chronic pain: Secondary | ICD-10-CM

## 2024-07-12 DIAGNOSIS — M25512 Pain in left shoulder: Secondary | ICD-10-CM | POA: Diagnosis not present

## 2024-07-12 DIAGNOSIS — M545 Low back pain, unspecified: Secondary | ICD-10-CM

## 2024-07-12 MED ORDER — TIZANIDINE HCL 2 MG PO TABS: 2.0000 mg | ORAL_TABLET | Freq: Two times a day (BID) | ORAL | 1 refills | Status: DC | PRN

## 2024-07-12 NOTE — Assessment & Plan Note (Deleted)
 Uncontrolled, last lipid panel 06/15/24

## 2024-07-12 NOTE — Assessment & Plan Note (Signed)
 Uncontrolled, no history of interventions for this shoulder, discussed surgical options but patient opted for more conservative management.  Full range of motion, pain worse when holding heavy objects. -Same plan as above

## 2024-07-12 NOTE — Assessment & Plan Note (Deleted)
 Uncontrolled, last A1C 06/15/24 was 8.7 Managed with [facility], next appt scheduled 11/01/24. -Last Endo visit increased Semaglutide  to 2mg  SQ Qweek -Metformin 

## 2024-07-12 NOTE — Progress Notes (Signed)
    SUBJECTIVE:   CHIEF COMPLAINT / HPI:   Back/hip pain: Chronic ongoing issue of bilateral hips, left worse than right, started in [place] 16 years ago.  Dull pain over iliac crest deep and joint, no radiation down leg or up spine. 5-6 out of 10 worse in the morning and previously improved with walking, but is now bad enough that patient cannot walk more than a minute.  history of bilateral steroid injections of hip which helped substantially.  Patient was asking about surgical options or repeated injection.  Also has tried physical therapy which helped moderately.  No recent injuries or falls, denies frequent NSAID use, no fevers , chillsm nausea m vomiting.  Pain also transmits to paraspinal muscles of lumbar vertebrae, with dull achiness, Baclofen which was prescribed 2 years ago no longer helping.  Shoulder pain: Left shoulder chronic issue for 15 years, recently worsening when bearing heavy objects with that arm.  Denies mobility issues or freezing, no recent injury to this shoulder.  History of right shoulder surgery in [place], unclear what was done.  No radiating or shooting pains.   PERTINENT  PMH / PSH: Right shoulder surgery 15 years ago in [place], unclear what was done Hernia repair with mesh about 15 years ago Degenerative disc disease  OBJECTIVE:   BP (!) 141/66   Pulse 80   Wt 155 lb 6.4 oz (70.5 kg)   SpO2 99%   BMI 25.86 kg/m    Cardiac: Regular rate and rhythm. Normal S1/S2. No murmurs, rubs, or gallops appreciated. Lungs: Clear bilaterally to ascultation.  MSK: Shoulders: Symmetric full range of motion bilaterally both active and passively.  No locking or clicking.  No tenderness of AC joints.  Strength 5 out of 5 bilaterally range of motion, except abduction 3+ out of 5 left side 5 out of 5 right side. Hips: Full range of motion bilaterally, strength 3+ out of 5 left side with flexion, otherwise 5 out of 5.  No point tenderness of SI joint, iliac crest, or greater  trochanter process. Psych: Pleasant and appropriate    ASSESSMENT/PLAN:   Assessment & Plan Primary osteoarthritis of left hip Poorly controlled, with worsening pain cannot tolerate more than thousand steps in a day.  Previous steroid injection about 5 years ago with good results, no systemic symptoms, physical exam findings consistent with osteoarthritis. low concern for septic arthritis or rheumatoid arthritis given physical exam and stable vitals as well as history of worse symptoms in morning's with improvement with use. -Referral to sports med for steroid injections of both hips and shoulders - PT referral placed -Discussed lifestyle and exercise interventions to improve pain Chronic midline low back pain without sciatica Uncontrolled, previously well-controlled with baclofen.  Likely associated to the worsening OA of hips -given history of improvement with baclofen decided to trial tizanidine  Chronic left shoulder pain Uncontrolled, no history of interventions for this shoulder, discussed surgical options but patient opted for more conservative management.  Full range of motion, pain worse when holding heavy objects. -Same plan as above     Fairy Amy, MD [facility]

## 2024-07-12 NOTE — Patient Instructions (Addendum)
 Thank you for visiting the clinic today, it was good to see you!  Please always bring your medication bottles  In today's visit we discussed:  Shoulder and hip pain: I have put in a referral to sports medicine and physical therapy.  Back pain: Stop taking Baclofen and try the new medication Tizanidine . Take it twice a day as needed.   Please follow-up in 1 month for kidney evaluation.  For any questions, please call the office at [phone] or send me a message in MyChart. Have a great day!  -Fairy Amy, MD  [facility] Resident, PGY-1   ??? ????????? ??????? ???? ??? ????, ???? ????? ???? ???!  ???? ????? ?????? ????? ????? ????? ????  ???? ?????????? ???? ????? ???:  ??? ??? ????? ??????: ??? ????????? ??????? ??? ?????? ?????? ???? ????? ?????? ??.  ????? ??????: ????????? ??????? ??? ??? ??? ??? ??? ?????????? ??????. ???? ???? ??????? ?? ??? ??.  ???? ????? ????? ????????? ???? 1 ???????? ????-?? ???.  ????? ??????? ????, ???? ????? ?????? [phone] ?? ??? ??? ???? MyChart ??? ??? ????? ?????. ????? ???? ??? ???!  -????? ????????, MD ??? ????? ?????? ??????? ?????????, PGY-1 ?j? klinikan? mul?k?ta l?v? badala ?bh?ra, taman? j?'?n? ?nanda thay?!  Kr?p? kar?n? hamm??? tam?r? dav?n? b??al? l?v?  ?jan? mul?k?tam?? ?pa?? carc? kar?:  Khabh? an? hipan? dukh?v?: M?? sp?r?sa m??isina an? phijhikala th?r?p? m??? r?pharala m?ky? ch?.  P??han? dukh?v?: B?kl?ph?na l?v?nu? bandha kar? an? nav? dav? ?ijh?ni??'ina ajam?v?. Jar?ra mujaba divasam?? b? v?ra l?.  Kr?p? kar?n? ki?an? m?ly??kana m??? 1 mahin?m?? ph?l?-apa kar?.  K?'?pa?a pra?n? m???, kr?p? kar?n? ?phisan? [phone] para k?la kar? athav? MyChart m?? man? sand??a m?kal?. Tam?r? divasa ?ubha rah?!  -J?s?pha n?yag?r?a, MD k?na h?ltha ph?mil? m??isina r?si??n?a, PGY-1

## 2024-07-25 ENCOUNTER — Ambulatory Visit

## 2024-08-09 ENCOUNTER — Ambulatory Visit

## 2024-08-09 VITALS — BP 102/60 | HR 100 | Wt 155.0 lb

## 2024-08-09 DIAGNOSIS — M25552 Pain in left hip: Secondary | ICD-10-CM

## 2024-08-09 DIAGNOSIS — M25551 Pain in right hip: Secondary | ICD-10-CM | POA: Diagnosis not present

## 2024-08-09 DIAGNOSIS — G8929 Other chronic pain: Secondary | ICD-10-CM | POA: Diagnosis not present

## 2024-08-09 DIAGNOSIS — K118 Other diseases of salivary glands: Secondary | ICD-10-CM

## 2024-08-09 DIAGNOSIS — M545 Low back pain, unspecified: Secondary | ICD-10-CM

## 2024-08-09 MED ORDER — ACETAMINOPHEN 325 MG PO TABS: 650.0000 mg | ORAL_TABLET | Freq: Four times a day (QID) | ORAL | 0 refills | Status: AC | PRN

## 2024-08-09 MED ORDER — TIZANIDINE HCL 2 MG PO TABS: 2.0000 mg | ORAL_TABLET | Freq: Two times a day (BID) | ORAL | 1 refills | Status: DC | PRN

## 2024-08-09 NOTE — Patient Instructions (Signed)
 Thank you for visiting the clinic today, it was good to see you!  Please always bring your medication bottles  In today's visit we discussed:  Hip pain: I suspect this is either greater trochanteric pain syndrome or osteoarthritis.  Since your symptoms are mild we prefer more conservative treatment at this time.  Please contact physical therapy so that you can do the regular exercises and stretches necessary to improve your symptoms.  This has been proven to be the most effective treatment.  [facility] [address] 2nd floor [phone]  You can also take 2 extra strength Tylenol  prior to exercise to reduce the severity of any of your pain. Sports medicine should also be calling you to schedule an appointment, you can also call them.  [facility]: I have put in a referral for ear nose and throat surgery to call you.  Please follow-up in 2 months  For any questions, please call the office at [phone] or send me a message in MyChart. Have a great day!  -Fairy Amy, MD  [facility] Resident, PGY-1

## 2024-08-09 NOTE — Assessment & Plan Note (Signed)
 Same plan as previously, put sports medicine phone number and phone contact so patient will answer the phone, also provided the phone number for physical therapy so that he can reach out to them.  Differential between hip osteoarthritis versus greater trochanteric pain syndrome.  Symptoms are mild given that they are not mobility restricting and only occur with extensive use more suspicious of OA.  Pain is localized directly over greater trochanter though this may be referred pain -Tylenol  1-2 extra strength tablets as needed -PT referral - Sports med referral -Left and right DG hip x-rays ordered

## 2024-08-09 NOTE — Progress Notes (Signed)
 SUBJECTIVE:   CHIEF COMPLAINT / HPI:   I am virtual interpreter was used during this visit to speak Gujarati  Hip pain: PT referral never followed through, but patient states that he does the exercises before bed and they help relieve symptoms.  Sports med appointment was a no-show, he does not answer any because it is a spam likely and does not use a voicemail so I added the sports med clinic number to his contact list .  Pain worse when walking, can go for about 1 mile 2-3 stops because of pain which resolves after about a 10 second break. Pain has increased in frequency and severity over the last few months. Pain located directly over greater trochanter. No shooting pain, radiation to other parts, or burning. Does endorse some numbness of left toes. He has not really taken medication to treat  States that he previously had an MRI finding something affecting his lumbar vertebrae but no records on file.  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 Reports that he does chew Betel nut which has been associated with head and neck squamous cell carcinoma  PERTINENT  PMH / PSH: History of lumbar radiculopathy, and osteoarthritis  OBJECTIVE:   BP 102/60   Pulse 100   Wt 155 lb (70.3 kg)   SpO2 96%   BMI 25.79 kg/m   Physical Exam Constitutional:      Appearance: Normal appearance.  HENT:     Head:     Comments: Small mobile, tender parotid mass superior to the left mandible Cardiovascular:     Rate and Rhythm: Normal rate and regular rhythm.  Pulmonary:     Effort: Pulmonary effort is normal.     Breath sounds: Normal breath sounds.  Musculoskeletal:     Cervical back: Normal range of motion and neck supple.     Comments: Hips: Full flexion, extension, abduction, adduction without pain or tenderness bilaterally.  Tender over  greater trochanter, and inferior aspect of superior iliac crest with no swelling or erythema   Neurological:     Mental Status: He is alert.  Psychiatric:        Mood and Affect: Mood normal.        Behavior: Behavior normal.      ASSESSMENT/PLAN:   Assessment & Plan Chronic pain of both hips Same plan as previously, put sports medicine phone number and phone contact so patient will answer the phone, also provided the phone number for physical therapy so that he can reach out to them.  Differential between hip osteoarthritis versus greater trochanteric pain syndrome.  Symptoms are mild given that they are not mobility restricting and only occur with extensive use more suspicious of OA.  Pain is localized directly over greater trochanter though this may be referred pain -Tylenol  1-2 extra strength tablets as needed -PT referral - Sports med referral -Left and right DG hip x-rays ordered Chronic midline low back pain without sciatica Patient states he never picked up his tizanidine  because [facility] said they never had it, when he tried to call them the never answered.  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  No other symptoms besides mass on exam. - ENT referral     Fairy  Lorrane, MD [facility]

## 2024-08-16 ENCOUNTER — Ambulatory Visit: Admitting: Physical Therapy

## 2024-08-23 ENCOUNTER — Other Ambulatory Visit: Payer: Self-pay

## 2024-08-23 DIAGNOSIS — G8929 Other chronic pain: Secondary | ICD-10-CM

## 2024-09-10 ENCOUNTER — Ambulatory Visit (INDEPENDENT_AMBULATORY_CARE_PROVIDER_SITE_OTHER): Admitting: Otolaryngology

## 2024-09-10 ENCOUNTER — Encounter (INDEPENDENT_AMBULATORY_CARE_PROVIDER_SITE_OTHER): Payer: Self-pay | Admitting: Otolaryngology

## 2024-09-10 VITALS — BP 86/46 | HR 111 | Ht 65.0 in | Wt 155.0 lb

## 2024-09-10 DIAGNOSIS — F17290 Nicotine dependence, other tobacco product, uncomplicated: Secondary | ICD-10-CM | POA: Diagnosis not present

## 2024-09-10 DIAGNOSIS — K118 Other diseases of salivary glands: Secondary | ICD-10-CM | POA: Diagnosis not present

## 2024-09-10 DIAGNOSIS — F17200 Nicotine dependence, unspecified, uncomplicated: Secondary | ICD-10-CM

## 2024-09-10 NOTE — Progress Notes (Signed)
 Dear Dr. Delores, Here is my assessment for our mutual patient, Mark Chase. Thank you for allowing me the opportunity to care for your patient. Please do not hesitate to contact me should you have any other questions. Sincerely, Dr. Eldora Blanch  Otolaryngology Clinic Note Referring provider: Dr. Delores HPI:  Mark Chase is a 73 y.o. male kindly referred by Dr. Delores for evaluation of parotid lesion  Initial visit (08/2024):  He noted left sided parotid mass, which he incidentally noted about 8 months ago. Not growing, not painful. Biopsy was done, which showed it was likely a benign Warthin's tumor. No fever, redness.  Patient otherwise denies: - dysphagia, odynophagia, unintentional weight loss - changes in voice, shortness of breath, hemoptysis - ear pain, neck masses  No prior skin cancers.   ENT Surgery: no Personal or FHx of bleeding dz or anesthesia difficulty: no  GLP-1: yes  Tobacco: dip use currently  PMHx: T2DM, DDD, HTN, HLD, Insomnia, Right eye blindness, Dyspnea  Independent Review of Additional Tests or Records:  Dr. Thedore (08/09/2024): noted parotid mass found incidentally on 02/06/2024; FNA done 04/03/2024, findings suggestive of Warthins; Parotid mass, ref to ENT 04/03/2024 FNA Left parotid: adequate sample, no immunophenotypic abnormalities; possible lymph node nodule 1, favor Warthin's Nodule 2 CT Head 02/06/2024: noted partially imaged left parotid lesion, 1.8 cm (vascular?) CT C-spine wo/ contrast 02/06/2024: left parotid mass as well 1.8 cm; smaller right nodule as well; no contrast so suboptimal eval but no other LAD noted CT Neck 01/25/2024:  PMH/Meds/All/SocHx/FamHx/ROS:   Past Medical History:  Diagnosis Date   Arthritis    Diabetes mellitus    Hyperlipidemia    Hypertension    Umbilical hernia      Past Surgical History:  Procedure Laterality Date   INSERTION OF MESH N/A 08/28/2015   Procedure: INSERTION OF MESH;  Surgeon: Camellia Blush, MD;   Location: [facility];  Service: General;  Laterality: N/A;   SHOULDER SURGERY     Right   UMBILICAL HERNIA REPAIR N/A 08/28/2015   Procedure: OPEN REPAIR UMBILICAL HERNIA;  Surgeon: Camellia Blush, MD;  Location: [facility];  Service: General;  Laterality: N/A;    Family History  Problem Relation Age of Onset   Hypertension Mother 1   Coronary artery disease Father 48     Social Connections: Unknown (05/03/2022)   Received from [facility]   Social Network    Social Network: Not on file      Current Outpatient Medications:    acetaminophen  (TYLENOL ) 325 MG tablet, Take 2 tablets (650 mg total) by mouth every 6 (six) hours as needed for up to 30 doses for moderate pain (pain score 4-6) or mild pain (pain score 1-3)., Disp: 30 tablet, Rfl: 0   aspirin 81 MG tablet, Take 81 mg by mouth daily., Disp: , Rfl:    cyanocobalamin  (,VITAMIN B-12,) 1000 MCG/ML injection, Inject into the muscle., Disp: , Rfl:    DULoxetine  (CYMBALTA ) 20 MG capsule, Take 1 capsule (20 mg total) by mouth 2 (two) times daily., Disp: 30 capsule, Rfl: 6   empagliflozin  (JARDIANCE ) 25 MG TABS tablet, Take 1 tablet (25 mg total) by mouth daily., Disp: 90 tablet, Rfl: 3   fenofibrate  (TRICOR ) 145 MG tablet, Take 1 tablet (145 mg total) by mouth daily., Disp: 90 tablet, Rfl: 3   gabapentin  (NEURONTIN ) 300 MG capsule, Take 1 capsule (300 mg total) by mouth daily. (Patient taking differently: Take 300 mg by mouth 2 (two) times daily.), Disp: 60  capsule, Rfl: 1   glipiZIDE  (GLUCOTROL  XL) 10 MG 24 hr tablet, Take 20 mg by mouth daily., Disp: , Rfl:    glucose blood test strip, Use as instructed, Disp: 100 each, Rfl: 1   losartan  (COZAAR ) 50 MG tablet, Take 1 tablet (50 mg total) by mouth daily., Disp: 90 tablet, Rfl: 3   metFORMIN  (GLUCOPHAGE ) 1000 MG tablet, Take 1 tablet (1,000 mg total) by mouth 2 (two) times daily with a meal., Disp: 180 tablet, Rfl: 3   pravastatin  (PRAVACHOL ) 80 MG tablet, Take 1 tablet (80 mg total) by mouth  at bedtime., Disp: 90 tablet, Rfl: 3   Semaglutide , 1 MG/DOSE, (OZEMPIC , 1 MG/DOSE,) 4 MG/3ML SOPN, Inject 1 mg into the skin once a week., Disp: 3 mL, Rfl: 1   tiZANidine  (ZANAFLEX ) 2 MG tablet, TAKE 1 TABLET (2 MG TOTAL) BY MOUTH 2 (TWO) TIMES DAILY AS NEEDED FOR MUSCLE SPASMS., Disp: 30 tablet, Rfl: 0   traZODone  (DESYREL ) 50 MG tablet, Take 1 tablet (50 mg total) by mouth at bedtime., Disp: 30 tablet, Rfl: 0   triamcinolone  ointment (KENALOG ) 0.5 %, Apply topically 2 (two) times daily., Disp: 45 g, Rfl: 1   diclofenac  Sodium (VOLTAREN ) 1 % GEL, APPLY  4 GRAMS OF GEL TOPICALLY TO AFFECTED AREA 4 TIMES DAILY (Patient not taking: Reported on 09/10/2024), Disp: 100 g, Rfl: 0   naltrexone  (DEPADE) 50 MG tablet, Take 2 tablets (100 mg total) by mouth daily. (Patient not taking: Reported on 09/10/2024), Disp: 60 tablet, Rfl: 1   thiamine  100 MG tablet, Take 1 tablet (100 mg total) by mouth daily. (Patient not taking: Reported on 09/10/2024), Disp: 90 tablet, Rfl: 3   Physical Exam:   BP (!) 86/46 (BP Location: Right Arm, Patient Position: Sitting, Cuff Size: Large)   Pulse (!) 111   Ht 5' 5 (1.651 m)   Wt 155 lb (70.3 kg)   SpO2 96%   BMI 25.79 kg/m   Salient findings:  CN II-XII intact, no facial numbness Bilateral EAC clear and TM intact with well pneumatized middle ear spaces Anterior rhinoscopy: Septum intact; bilateral inferior turbinates without significant hypertrophy No lesions of oral cavity/oropharynx; easily expressible saliva from b/l parotid ducts No obviously palpable neck masses/lymphadenopathy/thyromegaly except soft ~1x1 cm left parotid nodule; no overlying skin change or surrounding scars concerning for carcinoma No respiratory distress or stridor  Seprately Identifiable Procedures:  Prior to initiating any procedures, risks/benefits/alternatives were explained to the patient and verbal consent obtained. None  Impression & Plans:  Mark Chase is a 73 y.o. male  with:  1. Parotid nodule   2. Tobacco use disorder    FNA done, showing Warthins; does chew tobacco. Given FNA, we discussed options and he opted for observation, which is reasonable. Return precautions discussed.   Given the patient's tobacco use, I also discussed cessation and options for cessation, including counseling. Counseled patient on the dangers of tobacco use, advised patient to stop. Total time spent with this was 4 minutes.   F/u 1 year with US , sooner as necessary  See below regarding exact medications prescribed this encounter including dosages and route: No orders of the defined types were placed in this encounter.     Thank you for allowing me the opportunity to care for your patient. Please do not hesitate to contact me should you have any other questions.  Sincerely, Eldora Blanch, MD Otolaryngologist (ENT), [facility] Phone: [phone] Fax: [phone]  09/10/2024, 1:37 PM   I have  personally spent 45 minutes involved in face-to-face and non-face-to-face activities for this patient on the day of the visit.  Professional time spent excludes any procedures performed but includes the following activities, in addition to those noted in the documentation: preparing to see the patient (review of outside documentation and results), performing a medically appropriate examination, counseling, documenting in the electronic health record, independently interpreting results (US ).
